# Patient Record
Sex: Female | Born: 1960 | Race: White | Hispanic: No | Smoking: Never smoker
Health system: Southern US, Community
[De-identification: ages and names within clinical notes are randomized; demographics above are authoritative.]

## PROBLEM LIST (undated history)

## (undated) DIAGNOSIS — K5792 Diverticulitis of intestine, part unspecified, without perforation or abscess without bleeding: Secondary | ICD-10-CM

## (undated) DIAGNOSIS — Z9889 Other specified postprocedural states: Secondary | ICD-10-CM

## (undated) DIAGNOSIS — Z87442 Personal history of urinary calculi: Secondary | ICD-10-CM

## (undated) DIAGNOSIS — C4491 Basal cell carcinoma of skin, unspecified: Secondary | ICD-10-CM

## (undated) DIAGNOSIS — R112 Nausea with vomiting, unspecified: Secondary | ICD-10-CM

## (undated) HISTORY — PX: COLONOSCOPY: SHX174

## (undated) HISTORY — PX: BREAST SURGERY: SHX581

## (undated) HISTORY — PX: RHINOPLASTY: SUR1284

## (undated) HISTORY — PX: ABDOMINAL HYSTERECTOMY: SHX81

## (undated) HISTORY — PX: EYE SURGERY: SHX253

---

## 2004-12-06 ENCOUNTER — Other Ambulatory Visit: Payer: Self-pay

## 2004-12-07 ENCOUNTER — Ambulatory Visit: Payer: Self-pay | Admitting: Urology

## 2006-03-26 ENCOUNTER — Ambulatory Visit: Payer: Self-pay | Admitting: Urology

## 2006-03-27 ENCOUNTER — Ambulatory Visit: Payer: Self-pay | Admitting: Urology

## 2006-03-28 ENCOUNTER — Ambulatory Visit: Payer: Self-pay | Admitting: Urology

## 2006-09-03 ENCOUNTER — Emergency Department: Payer: Self-pay | Admitting: Emergency Medicine

## 2007-02-13 ENCOUNTER — Ambulatory Visit: Payer: Self-pay | Admitting: Urology

## 2007-04-02 ENCOUNTER — Ambulatory Visit: Payer: Self-pay | Admitting: Urology

## 2007-08-26 ENCOUNTER — Ambulatory Visit: Payer: Self-pay | Admitting: Internal Medicine

## 2008-04-15 ENCOUNTER — Ambulatory Visit: Payer: Self-pay | Admitting: Specialist

## 2008-04-29 ENCOUNTER — Ambulatory Visit: Payer: Self-pay | Admitting: Specialist

## 2008-06-16 ENCOUNTER — Encounter: Payer: Self-pay | Admitting: Specialist

## 2008-07-06 ENCOUNTER — Encounter: Payer: Self-pay | Admitting: Specialist

## 2008-08-05 ENCOUNTER — Encounter: Payer: Self-pay | Admitting: Specialist

## 2008-10-18 ENCOUNTER — Ambulatory Visit: Payer: Self-pay | Admitting: Urology

## 2008-10-19 ENCOUNTER — Ambulatory Visit: Payer: Self-pay | Admitting: Urology

## 2008-10-21 ENCOUNTER — Ambulatory Visit: Payer: Self-pay | Admitting: Urology

## 2010-06-26 ENCOUNTER — Emergency Department: Payer: Self-pay | Admitting: Emergency Medicine

## 2010-11-14 ENCOUNTER — Ambulatory Visit: Payer: Self-pay | Admitting: Urology

## 2010-11-20 ENCOUNTER — Ambulatory Visit: Payer: Self-pay | Admitting: Urology

## 2010-11-30 ENCOUNTER — Ambulatory Visit: Payer: Self-pay | Admitting: Urology

## 2011-01-14 ENCOUNTER — Ambulatory Visit: Payer: Self-pay | Admitting: Family Medicine

## 2011-03-05 ENCOUNTER — Ambulatory Visit: Payer: Self-pay | Admitting: Internal Medicine

## 2012-02-10 IMAGING — CR DG ANKLE COMPLETE 3+V*L*
1 series · 5 of 5 positions shown · non-contrast
Comparison: none

REASON FOR EXAM: Twisted; + swelling
COMMENTS:   LMP: Post-Menopausal

[Series 1: view not recorded · 0.17mm/px · 5 of 5 slices shown]
[im 1/5]
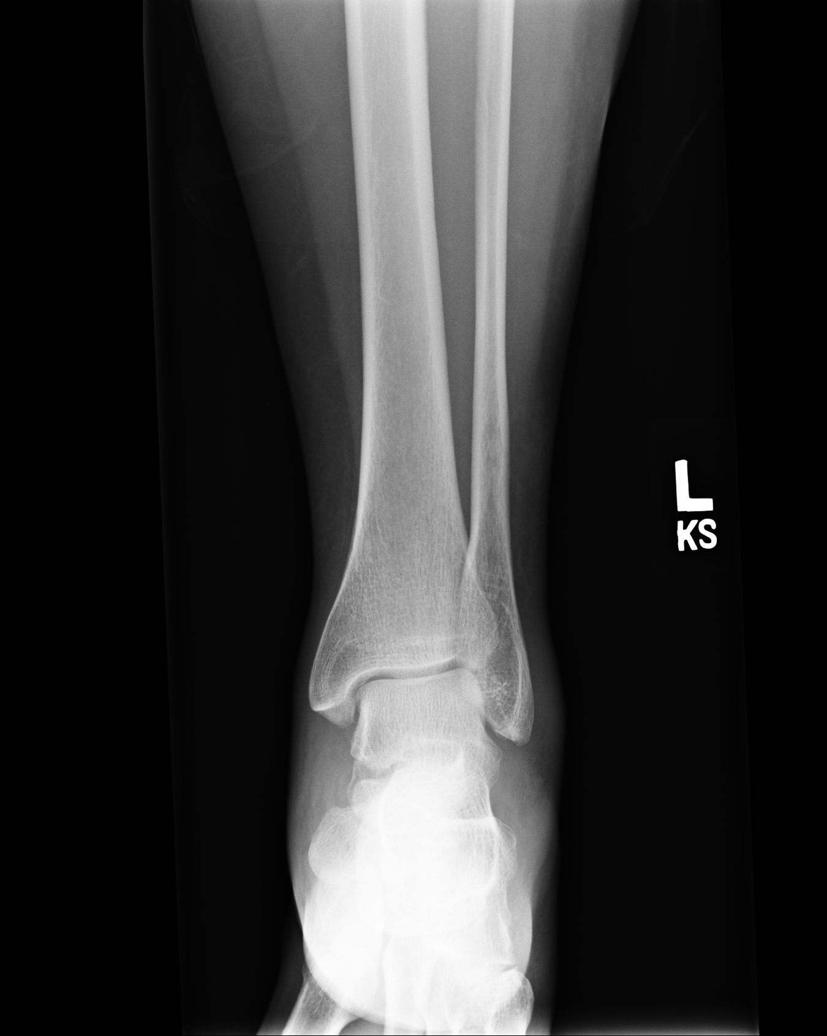
[im 2/5]
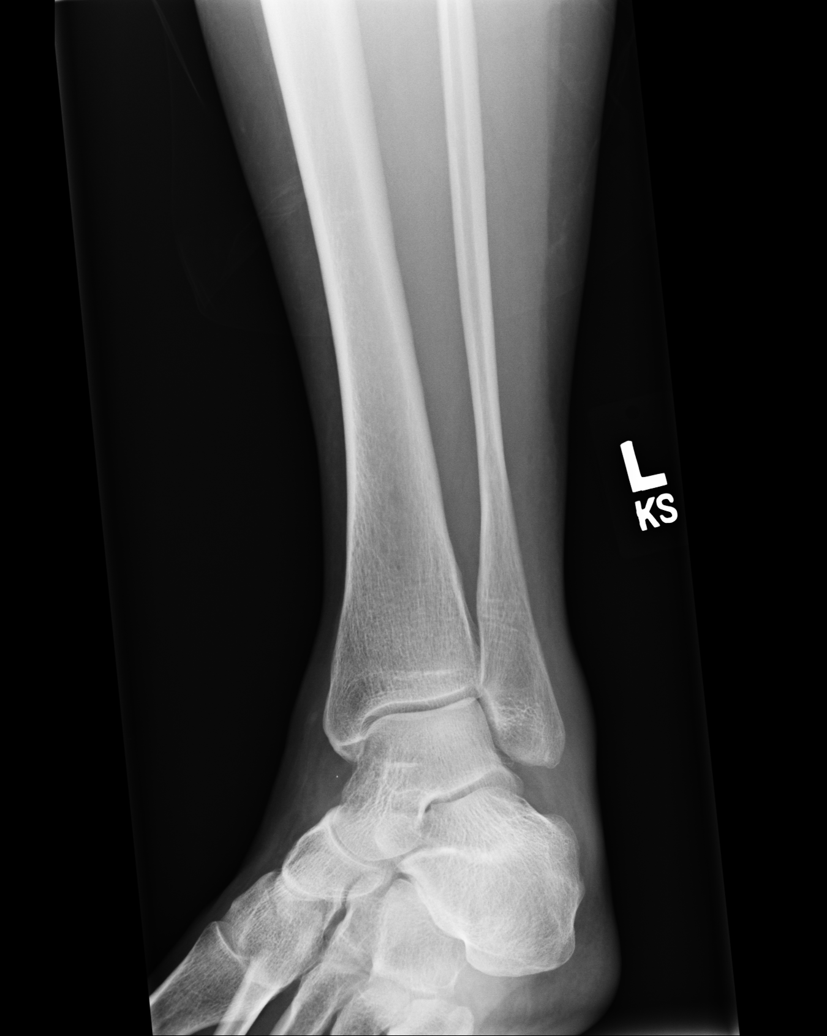
[im 3/5]
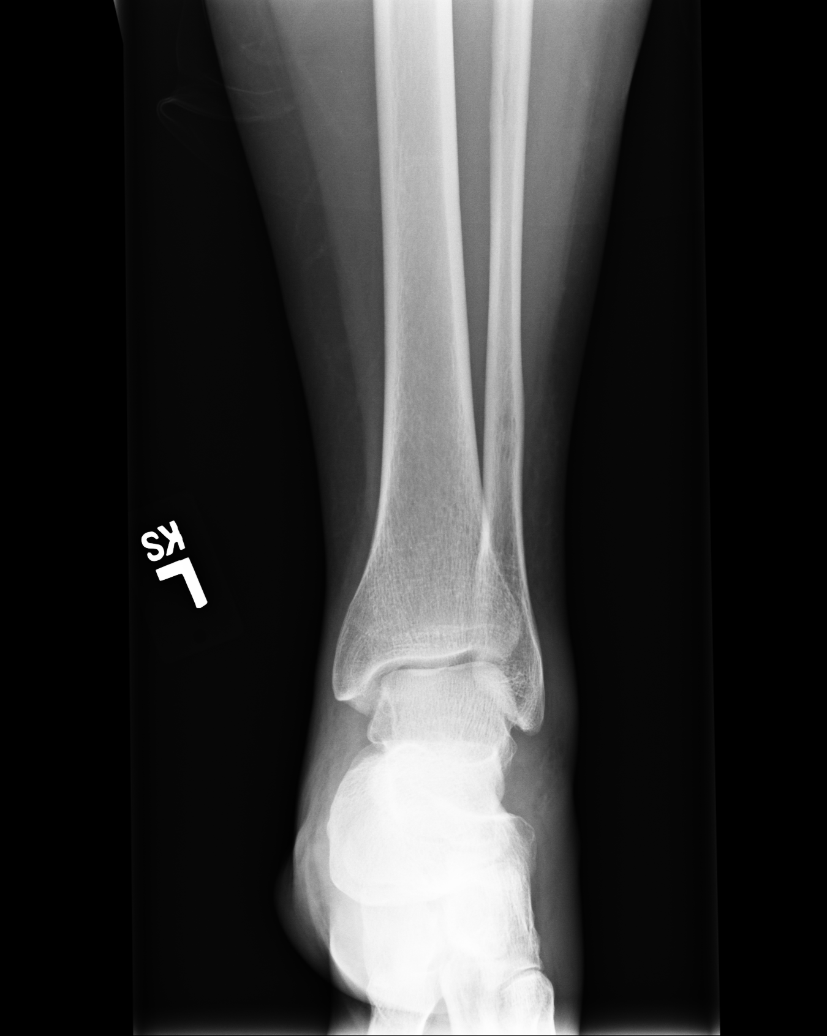
[im 4/5]
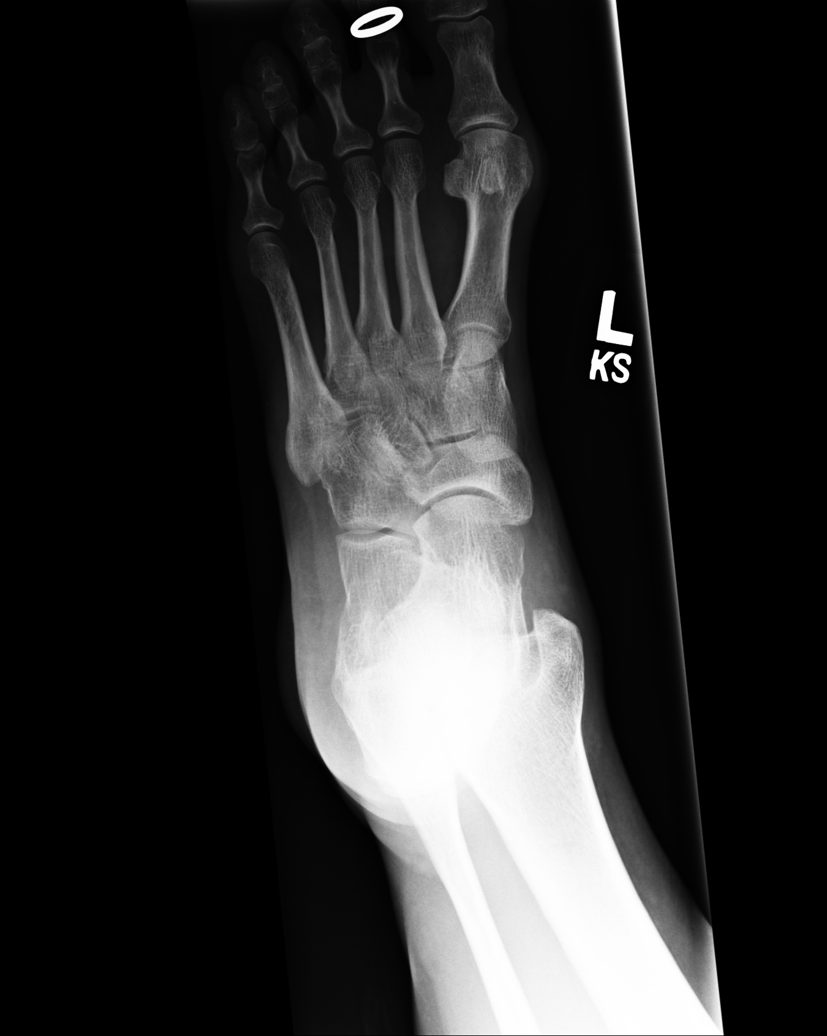
[im 5/5]
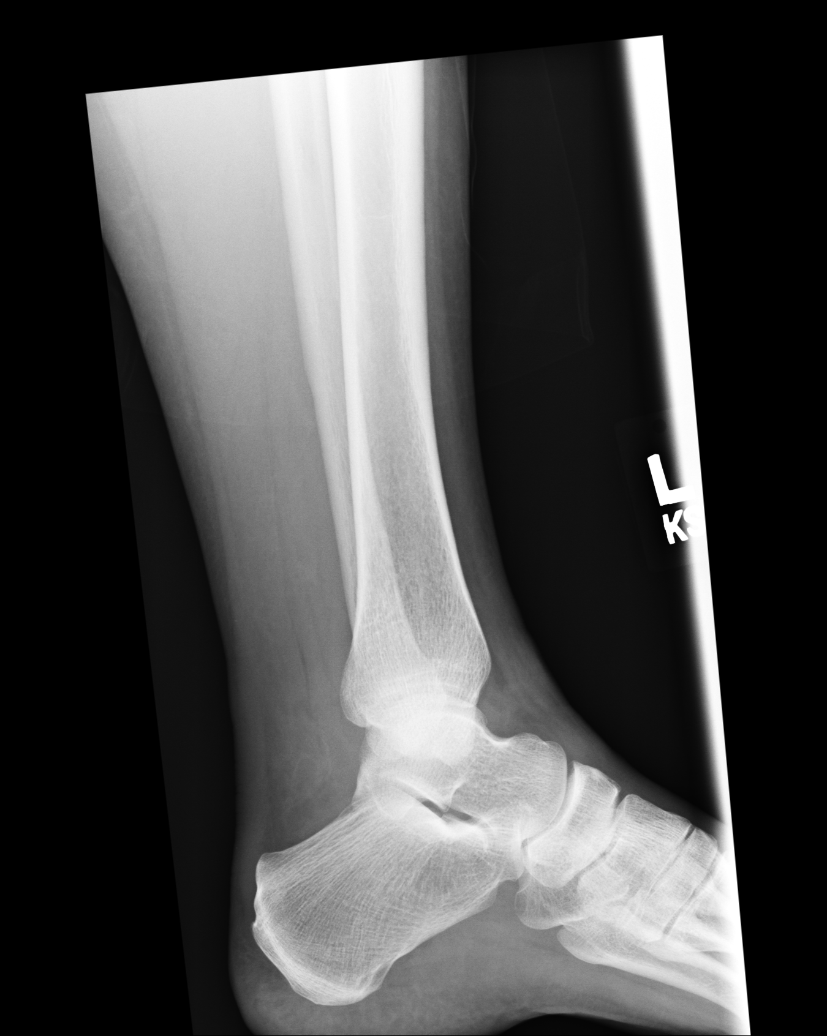

[5 of 5 positions shown; findings below may reference images not displayed]

PROCEDURE:     MDR - MDR ANKLE LEFT COMPLETE  - January 14, 2011  [DATE]

RESULT:     Five views of the left ankle are submitted. The ankle joint
mortise is preserved. The talar dome is intact. I do not see objective
evidence of an acute malleolar fracture. On an oblique view a subtle bony
density is noted along the inferior aspect of the articulation of the fibula
with the lateral aspect of the talus. A definite donor site is not
demonstrated. There is mild soft tissue swelling over the lateral malleolus.
IMPRESSION: I do not see objective evidence of an acute fracture. There
is mild soft tissue swelling laterally. Followup MRI may be useful if the
patient's symptoms do not resolve in a fashion consistent with an
uncomplicated sprain or contusion.

## 2014-11-08 ENCOUNTER — Ambulatory Visit: Payer: Self-pay | Admitting: Family Medicine

## 2016-11-05 HISTORY — PX: URETEROLITHOTOMY: SHX71

## 2016-12-05 HISTORY — PX: CYSTOSCOPY: SUR368

## 2017-01-24 DIAGNOSIS — N200 Calculus of kidney: Secondary | ICD-10-CM | POA: Insufficient documentation

## 2017-08-05 DIAGNOSIS — Z87442 Personal history of urinary calculi: Secondary | ICD-10-CM

## 2017-08-05 HISTORY — DX: Personal history of urinary calculi: Z87.442

## 2017-08-27 ENCOUNTER — Ambulatory Visit (INDEPENDENT_AMBULATORY_CARE_PROVIDER_SITE_OTHER): Payer: BC Managed Care – PPO | Admitting: Urology

## 2017-08-27 ENCOUNTER — Encounter: Payer: Self-pay | Admitting: Urology

## 2017-08-27 ENCOUNTER — Ambulatory Visit
Admission: RE | Admit: 2017-08-27 | Discharge: 2017-08-27 | Disposition: A | Discharge status: Home / Self Care | Payer: BC Managed Care – PPO | Source: Ambulatory Visit | Attending: Urology | Admitting: Urology

## 2017-08-27 VITALS — BP 121/80 | HR 97 | Ht 68.0 in | Wt 156.6 lb

## 2017-08-27 DIAGNOSIS — N200 Calculus of kidney: Secondary | ICD-10-CM

## 2017-08-28 LAB — MICROSCOPIC EXAMINATION
BACTERIA UA: NONE SEEN
Epithelial Cells (non renal): NONE SEEN /hpf (ref 0–10)
RBC, UA: >30 /hpf — ABNORMAL HIGH (ref 0–?)

## 2017-08-28 LAB — URINALYSIS, COMPLETE
Bilirubin, UA: NEGATIVE
GLUCOSE, UA: NEGATIVE
Ketones, UA: NEGATIVE
Nitrite, UA: NEGATIVE
PH UA: 6 (ref 5.0–7.5)
Specific Gravity, UA: 1.025 (ref 1.005–1.030)
Urobilinogen, Ur: 0.2 mg/dL (ref 0.2–1.0)

## 2017-08-28 NOTE — Progress Notes (Signed)
08/27/2017 8:20 AM   Lawrence Marseilles February 04, 1961 [id number]   Chief Complaint  Patient presents with  . Nephrolithiasis    HPI: 56 y.o. female with recurrent nephrolithiasis.  She is status post left ureteroscopic stone removal January 2018.  She has right nephrolithiasis with a nonobstructing right renal pelvic calculus and was preparing to be treated with shockwave lithotripsy however in late September she developed left renal colic.  A KUB showed left nephrolithiasis but a definite ureteral calculus was not identified.  She had significant increase in her pain last week.  She denies fever, chills, nausea or vomiting.  There are no identifiable precipitating, aggravating or alleviating factors.  Pain severity has rated from mild to severe.   PMH: Recurrent nephrolithiasis Diverticulitis  Surgical History: . BREAST BIOPSY  . COLONOSCOPY  . HYSTERECTOMY  . MAMMOGRAM, BILATERAL [facility] HISTORICAL RESULT) 2017  . PR CYSTO/URETERO/PYELOSCOPY W/LITHOTRIPSY Left 12/05/2016  Procedure: CYSTOURETHROSCOPY, WITH URETEROSCOPY &/OR PYELOSCOPY; WITH LITHOTRIPSY (URETERAL CATHETERIZATION INCLUDED); Surgeon: Karlyne Greenspan, MD; Location: [facility]; Service: Urology  . PR CYSTOSCOPY,INSERT URETERAL STENT Left 12/05/2016  Procedure: CYSTOURETHROSCOPY, WITH INSERTION OF INDWELLING URETERAL STENT (EG, GIBBONS OR DOUBLE-J TYPE); Surgeon: Karlyne Greenspan, MD; Location: [place]; Service: Urology  . Ureterolithiasis    Home Medications:  Allergies as of 08/27/2017   No Known Allergies     Medication List    as of 08/27/2017 11:59 PM   You have not been prescribed any medications.     Allergies: No Known Allergies  Family History: Family History  Problem Relation Age of Onset  . Heart Problems Father   . Healthy Mother     Social History:  has no tobacco, alcohol, and drug history on file.  ROS: UROLOGY Frequent Urination?: No Hard to  postpone urination?: No Burning/pain with urination?: No Get up at night to urinate?: Yes Leakage of urine?: No Urine stream starts and stops?: No Trouble starting stream?: No Do you have to strain to urinate?: No Blood in urine?: No Urinary tract infection?: No Sexually transmitted disease?: No Injury to kidneys or bladder?: No Painful intercourse?: No Weak stream?: No Currently pregnant?: No Vaginal bleeding?: No Last menstrual period?: n  Gastrointestinal Nausea?: No Vomiting?: No Indigestion/heartburn?: No Diarrhea?: No Constipation?: No  Constitutional Fever: No Night sweats?: No Weight loss?: No Fatigue?: No  Skin Skin rash/lesions?: No Itching?: No  Eyes Blurred vision?: No Double vision?: No  Ears/Nose/Throat Sore throat?: No Sinus problems?: No  Hematologic/Lymphatic Swollen glands?: No Easy bruising?: No  Cardiovascular Leg swelling?: No Chest pain?: No  Respiratory Cough?: No Shortness of breath?: No  Endocrine Excessive thirst?: No  Musculoskeletal Back pain?: No Joint pain?: No  Neurological Headaches?: No Dizziness?: No  Psychologic Depression?: No Anxiety?: No  Physical Exam: BP 121/80 (BP Location: Right Arm, Patient Position: Sitting, Cuff Size: Normal)   Pulse 97   Ht 5\' 8"  (1.727 m)   Wt 156 lb 9.6 oz (71 kg)   BMI 23.81 kg/m   Constitutional:  Alert and oriented, No acute distress. HEENT: [place] AT, moist mucus membranes.  Trachea midline, no masses. Cardiovascular: No clubbing, cyanosis, or edema. RRR Respiratory: Normal respiratory effort, no increased work of breathing. Lungs clear GI: Abdomen is soft, nontender, nondistended, no abdominal masses GU: No CVA tenderness.  Skin: No rashes, bruises or suspicious lesions. Lymph: No cervical or inguinal adenopathy. Neurologic: Grossly intact, no focal deficits, moving all 4 extremities. Psychiatric: Normal mood and affect.  Laboratory Data:  Urinalysis Lab Results    Component Value Date   SPECGRAV 1.025 08/27/2017   PHUR 6.0 08/27/2017   COLORU Yellow 08/27/2017   APPEARANCEUR Clear 08/27/2017   LEUKOCYTESUR 1+ (A) 08/27/2017   PROTEINUR 2+ (A) 08/27/2017   GLUCOSEU Negative 08/27/2017   KETONESU Negative 08/27/2017   RBCU 3+ (A) 08/27/2017   BILIRUBINUR Negative 08/27/2017   UUROB 0.2 08/27/2017   NITRITE Negative 08/27/2017    Lab Results  Component Value Date   LABMICR See below: 08/27/2017   WBCUA 0-5 08/27/2017   RBCUA >30 (H) 08/27/2017   LABEPIT None seen 08/27/2017   MUCUS Present (A) 08/27/2017   BACTERIA None seen 08/27/2017    Pertinent Imaging: KUB reviewed   Assessment & Plan:   1.  Left renal colic  Repeat KUB was performed today and there is an approximately 8 mm left proximal ureteral calculus.  Stable right nephrolithiasis with 9 and 10 mm calculi.  She was notified with results.  Treatment options were discussed including shockwave lithotripsy and ureteroscopy with laser lithotripsy.  Bilateral shockwave lithotripsy could not be performed however bilateral ureteroscopy could.  The pros and cons of each treatment were discussed.  Based on stone size it is unlikely the left ureteral calculus will pass.  - Urinalysis, Complete - Abdomen 1 view (KUB); Future     Abbie Sons, [place] [address] [place],  [address] [phone]

## 2017-08-30 ENCOUNTER — Telehealth: Payer: Self-pay | Admitting: Urology

## 2017-08-30 NOTE — Telephone Encounter (Signed)
-----   Message from Abbie Sons, MD sent at 08/30/2017  7:52 AM EDT ----- Notified of KUB results.  After discussing options she desires to schedule shockwave lithotripsy for her left ureteral calculus next week.

## 2017-08-30 NOTE — Telephone Encounter (Signed)
Patient needs Litho next week   Thanks, Sharyn Lull

## 2017-09-02 ENCOUNTER — Encounter: Payer: Self-pay | Admitting: Urology

## 2017-09-03 ENCOUNTER — Encounter: Payer: Self-pay | Admitting: *Deleted

## 2017-09-04 MED ORDER — CIPROFLOXACIN HCL 500 MG PO TABS
500.0000 mg | ORAL_TABLET | ORAL | Status: AC
  Administered 2017-09-05: 500 mg via ORAL

## 2017-09-05 ENCOUNTER — Encounter
Admission: RE | Disposition: A | Discharge status: Home / Self Care | Payer: Self-pay | Source: Ambulatory Visit | Attending: Urology

## 2017-09-05 ENCOUNTER — Ambulatory Visit: Payer: BC Managed Care – PPO

## 2017-09-05 ENCOUNTER — Ambulatory Visit
Admission: RE | Admit: 2017-09-05 | Discharge: 2017-09-05 | Disposition: A | Discharge status: Home / Self Care | Payer: BC Managed Care – PPO | Source: Ambulatory Visit | Attending: Urology | Admitting: Urology

## 2017-09-05 ENCOUNTER — Encounter: Payer: Self-pay | Admitting: *Deleted

## 2017-09-05 DIAGNOSIS — Z90710 Acquired absence of both cervix and uterus: Secondary | ICD-10-CM | POA: Diagnosis not present

## 2017-09-05 DIAGNOSIS — N202 Calculus of kidney with calculus of ureter: Secondary | ICD-10-CM | POA: Diagnosis present

## 2017-09-05 DIAGNOSIS — Z8719 Personal history of other diseases of the digestive system: Secondary | ICD-10-CM | POA: Insufficient documentation

## 2017-09-05 DIAGNOSIS — N200 Calculus of kidney: Secondary | ICD-10-CM

## 2017-09-05 HISTORY — PX: EXTRACORPOREAL SHOCK WAVE LITHOTRIPSY: SHX1557

## 2017-09-05 HISTORY — DX: Diverticulitis of intestine, part unspecified, without perforation or abscess without bleeding: K57.92

## 2017-09-05 HISTORY — DX: Personal history of urinary calculi: Z87.442

## 2017-09-05 SURGERY — LITHOTRIPSY, ESWL
Anesthesia: Moderate Sedation | Laterality: Left

## 2017-09-05 MED ORDER — TAMSULOSIN HCL 0.4 MG PO CAPS: 0.4000 mg | ORAL_CAPSULE | Freq: Every day | ORAL | 0 refills | Status: DC

## 2017-09-05 MED ORDER — ONDANSETRON HCL 4 MG/2ML IJ SOLN
4.0000 mg | Freq: Once | INTRAMUSCULAR | Status: AC
  Administered 2017-09-05: 4 mg via INTRAVENOUS

## 2017-09-05 MED ORDER — DIPHENHYDRAMINE HCL 25 MG PO CAPS
25.0000 mg | ORAL_CAPSULE | ORAL | Status: AC
  Administered 2017-09-05: 25 mg via ORAL

## 2017-09-05 MED ORDER — OXYCODONE-ACETAMINOPHEN 5-325 MG PO TABS: 1.0000 | ORAL_TABLET | ORAL | 0 refills | Status: DC | PRN

## 2017-09-05 MED ORDER — SODIUM CHLORIDE 0.9 % IV SOLN
Freq: Once | INTRAVENOUS | Status: AC
  Administered 2017-09-05: 08:00:00 via INTRAVENOUS

## 2017-09-05 MED ORDER — DIAZEPAM 5 MG PO TABS
10.0000 mg | ORAL_TABLET | ORAL | Status: AC
  Administered 2017-09-05: 10 mg via ORAL

## 2017-09-05 MED ORDER — DIAZEPAM 5 MG PO TABS
ORAL_TABLET | ORAL | Status: AC
  Administered 2017-09-05: 10 mg via ORAL
  Filled 2017-09-05: qty 2

## 2017-09-05 MED ORDER — CIPROFLOXACIN HCL 500 MG PO TABS
ORAL_TABLET | ORAL | Status: AC
  Administered 2017-09-05: 500 mg via ORAL
  Filled 2017-09-05: qty 1

## 2017-09-05 MED ORDER — DIPHENHYDRAMINE HCL 25 MG PO CAPS
ORAL_CAPSULE | ORAL | Status: AC
  Administered 2017-09-05: 25 mg via ORAL
  Filled 2017-09-05: qty 1

## 2017-09-05 MED ORDER — ONDANSETRON HCL 4 MG/2ML IJ SOLN
INTRAMUSCULAR | Status: AC
  Administered 2017-09-05: 4 mg via INTRAVENOUS
  Filled 2017-09-05: qty 2

## 2017-09-05 NOTE — H&P (View-Only) (Signed)
08/27/2017 8:20 AM   Brittney Andrews 06-26-1961 [id number]   Chief Complaint  Patient presents with  . Nephrolithiasis    HPI: 56 y.o. female with recurrent nephrolithiasis.  She is status post left ureteroscopic stone removal January 2018.  She has right nephrolithiasis with a nonobstructing right renal pelvic calculus and was preparing to be treated with shockwave lithotripsy however in late September she developed left renal colic.  A KUB showed left nephrolithiasis but a definite ureteral calculus was not identified.  She had significant increase in her pain last week.  She denies fever, chills, nausea or vomiting.  There are no identifiable precipitating, aggravating or alleviating factors.  Pain severity has rated from mild to severe.   PMH: Recurrent nephrolithiasis Diverticulitis  Surgical History: . BREAST BIOPSY  . COLONOSCOPY  . HYSTERECTOMY  . MAMMOGRAM, BILATERAL [facility] HISTORICAL RESULT) 2017  . PR CYSTO/URETERO/PYELOSCOPY W/LITHOTRIPSY Left 12/05/2016  Procedure: CYSTOURETHROSCOPY, WITH URETEROSCOPY &/OR PYELOSCOPY; WITH LITHOTRIPSY (URETERAL CATHETERIZATION INCLUDED); Surgeon: Karlyne Greenspan, MD; Location: [place]; Service: Urology  . PR CYSTOSCOPY,INSERT URETERAL STENT Left 12/05/2016  Procedure: CYSTOURETHROSCOPY, WITH INSERTION OF INDWELLING URETERAL STENT (EG, GIBBONS OR DOUBLE-J TYPE); Surgeon: Karlyne Greenspan, MD; Location: [place]; Service: Urology  . Ureterolithiasis    Home Medications:  Allergies as of 08/27/2017   No Known Allergies     Medication List    as of 08/27/2017 11:59 PM   You have not been prescribed any medications.     Allergies: No Known Allergies  Family History: Family History  Problem Relation Age of Onset  . Heart Problems Father   . Healthy Mother     Social History:  has no tobacco, alcohol, and drug history on file.  ROS: UROLOGY Frequent Urination?: No Hard to  postpone urination?: No Burning/pain with urination?: No Get up at night to urinate?: Yes Leakage of urine?: No Urine stream starts and stops?: No Trouble starting stream?: No Do you have to strain to urinate?: No Blood in urine?: No Urinary tract infection?: No Sexually transmitted disease?: No Injury to kidneys or bladder?: No Painful intercourse?: No Weak stream?: No Currently pregnant?: No Vaginal bleeding?: No Last menstrual period?: n  Gastrointestinal Nausea?: No Vomiting?: No Indigestion/heartburn?: No Diarrhea?: No Constipation?: No  Constitutional Fever: No Night sweats?: No Weight loss?: No Fatigue?: No  Skin Skin rash/lesions?: No Itching?: No  Eyes Blurred vision?: No Double vision?: No  Ears/Nose/Throat Sore throat?: No Sinus problems?: No  Hematologic/Lymphatic Swollen glands?: No Easy bruising?: No  Cardiovascular Leg swelling?: No Chest pain?: No  Respiratory Cough?: No Shortness of breath?: No  Endocrine Excessive thirst?: No  Musculoskeletal Back pain?: No Joint pain?: No  Neurological Headaches?: No Dizziness?: No  Psychologic Depression?: No Anxiety?: No  Physical Exam: BP 121/80 (BP Location: Right Arm, Patient Position: Sitting, Cuff Size: Normal)   Pulse 97   Ht 5\' 8"  (1.727 m)   Wt 156 lb 9.6 oz (71 kg)   BMI 23.81 kg/m   Constitutional:  Alert and oriented, No acute distress. HEENT: [place] AT, moist mucus membranes.  Trachea midline, no masses. Cardiovascular: No clubbing, cyanosis, or edema. RRR Respiratory: Normal respiratory effort, no increased work of breathing. Lungs clear GI: Abdomen is soft, nontender, nondistended, no abdominal masses GU: No CVA tenderness.  Skin: No rashes, bruises or suspicious lesions. Lymph: No cervical or inguinal adenopathy. Neurologic: Grossly intact, no focal deficits, moving all 4 extremities. Psychiatric: Normal mood and affect.  Laboratory Data:  Urinalysis Lab Results    Component Value Date   SPECGRAV 1.025 08/27/2017   PHUR 6.0 08/27/2017   COLORU Yellow 08/27/2017   APPEARANCEUR Clear 08/27/2017   LEUKOCYTESUR 1+ (A) 08/27/2017   PROTEINUR 2+ (A) 08/27/2017   GLUCOSEU Negative 08/27/2017   KETONESU Negative 08/27/2017   RBCU 3+ (A) 08/27/2017   BILIRUBINUR Negative 08/27/2017   UUROB 0.2 08/27/2017   NITRITE Negative 08/27/2017    Lab Results  Component Value Date   LABMICR See below: 08/27/2017   WBCUA 0-5 08/27/2017   RBCUA >30 (H) 08/27/2017   LABEPIT None seen 08/27/2017   MUCUS Present (A) 08/27/2017   BACTERIA None seen 08/27/2017    Pertinent Imaging: KUB reviewed   Assessment & Plan:   1.  Left renal colic  Repeat KUB was performed today and there is an approximately 8 mm left proximal ureteral calculus.  Stable right nephrolithiasis with 9 and 10 mm calculi.  She was notified with results.  Treatment options were discussed including shockwave lithotripsy and ureteroscopy with laser lithotripsy.  Bilateral shockwave lithotripsy could not be performed however bilateral ureteroscopy could.  The pros and cons of each treatment were discussed.  Based on stone size it is unlikely the left ureteral calculus will pass.  - Urinalysis, Complete - Abdomen 1 view (KUB); Future     Abbie Sons, [place] [address], [place], [place] [address] [phone]

## 2017-09-05 NOTE — Interval H&P Note (Signed)
History and Physical Interval Note:  09/05/2017 8:21 AM  Brittney Andrews  has presented today for surgery, with the diagnosis of Kidney stone  The various methods of treatment have been discussed with the patient and family. After consideration of risks, benefits and other options for treatment, the patient has consented to  Procedure(s): EXTRACORPOREAL SHOCK WAVE LITHOTRIPSY (ESWL) (Left) as a surgical intervention .  The patient's history has been reviewed, patient examined, no change in status, stable for surgery.  I have reviewed the patient's chart and labs.  Questions were answered to the patient's satisfaction.    RRR CTAB   Hollice Espy

## 2017-09-05 NOTE — Discharge Instructions (Signed)
See [facility] discharge instructions in chart.  

## 2017-09-19 ENCOUNTER — Ambulatory Visit (INDEPENDENT_AMBULATORY_CARE_PROVIDER_SITE_OTHER): Payer: BC Managed Care – PPO | Admitting: Urology

## 2017-09-19 ENCOUNTER — Encounter: Payer: Self-pay | Admitting: Urology

## 2017-09-19 ENCOUNTER — Ambulatory Visit
Admission: RE | Admit: 2017-09-19 | Discharge: 2017-09-19 | Disposition: A | Discharge status: Home / Self Care | Payer: BC Managed Care – PPO | Source: Ambulatory Visit | Attending: Urology | Admitting: Urology

## 2017-09-19 VITALS — BP 104/66 | HR 78 | Ht 68.0 in | Wt 150.0 lb

## 2017-09-19 DIAGNOSIS — N200 Calculus of kidney: Secondary | ICD-10-CM

## 2017-09-19 NOTE — H&P (View-Only) (Signed)
09/19/2017 1:35 PM   Brittney Andrews Jun 17, 1961 [id number]  Referring provider: Sofie Hartigan, MD [place], [place] [address]  HPI: 56 year old female presents for postop follow-up.  She is status post shockwave lithotripsy of a left proximal ureteral calculus on 09/05/2017.  She had no postoperative problems and had no significant colic.  She passed several small fragments which she brings in today.  She has no significant pain.  She does have right nephrolithiasis and wants to schedule treatment of her right-sided stones before the end of the year.   PMH: Past Medical History:  Diagnosis Date  . Diverticulitis   . History of kidney stones 08/2017   right nephrolithiasis, left renal colic    Surgical History: Past Surgical History:  Procedure Laterality Date  . ABDOMINAL HYSTERECTOMY    . BREAST SURGERY     biopsy  . COLONOSCOPY    . CYSTOSCOPY Left 12/05/2016   stent placement  . EXTRACORPOREAL SHOCK WAVE LITHOTRIPSY Left 09/05/2017   Procedure: EXTRACORPOREAL SHOCK WAVE LITHOTRIPSY (ESWL);  Surgeon: Hollice Espy, MD;  Location: [facility];  Service: Urology;  Laterality: Left;  . URETEROLITHOTOMY Left 11/2016    Home Medications:  Allergies as of 09/19/2017   No Known Allergies     Medication List    as of 09/19/2017  1:35 PM   You have not been prescribed any medications.     Allergies: No Known Allergies  Family History: Family History  Problem Relation Age of Onset  . Heart Problems Father   . Healthy Mother     Social History:  reports that  has never smoked. she has never used smokeless tobacco. She reports that she does not drink alcohol or use drugs.  ROS: UROLOGY Frequent Urination?: No Hard to postpone urination?: No Burning/pain with urination?: No Get up at night to urinate?: Yes Leakage of urine?: No Urine stream starts and stops?: No Trouble starting stream?: No Do you have to strain to urinate?: No Blood in  urine?: No Urinary tract infection?: No Sexually transmitted disease?: No Injury to kidneys or bladder?: No Painful intercourse?: No Weak stream?: No Currently pregnant?: No Vaginal bleeding?: No Last menstrual period?: n  Gastrointestinal Nausea?: No Vomiting?: No Indigestion/heartburn?: No Diarrhea?: No Constipation?: No  Constitutional Fever: No Night sweats?: No Weight loss?: No Fatigue?: No  Skin Skin rash/lesions?: No Itching?: No  Eyes Blurred vision?: No Double vision?: No  Ears/Nose/Throat Sore throat?: No Sinus problems?: No  Hematologic/Lymphatic Swollen glands?: No Easy bruising?: No  Cardiovascular Leg swelling?: No Chest pain?: No  Respiratory Cough?: No Shortness of breath?: No  Endocrine Excessive thirst?: No  Musculoskeletal Back pain?: No Joint pain?: No  Neurological Headaches?: No Dizziness?: No  Psychologic Depression?: No Anxiety?: No  Physical Exam: BP 104/66   Pulse 78   Ht 5\' 8"  (1.727 m)   Wt 150 lb (68 kg)   BMI 22.81 kg/m   Constitutional:  Alert and oriented, No acute distress. HEENT: [place] AT, moist mucus membranes.  Trachea midline, no masses. Cardiovascular: No clubbing, cyanosis, or edema. Respiratory: Normal respiratory effort, no increased work of breathing. GI: Abdomen is soft, nontender, nondistended, no abdominal masses GU: No CVA tenderness.  Skin: No rashes, bruises or suspicious lesions. Lymph: No cervical or inguinal adenopathy. Neurologic: Grossly intact, no focal deficits, moving all 4 extremities. Psychiatric: Normal mood and affect.   Assessment & Plan:    1. Nephrolithiasis KUB performed today was reviewed.  There are no calcifications  or fragments identified along the expected course of the ureter.  Right nephrolithiasis noted. Schedule follow-up renal ultrasound to make sure there is no left hydronephrosis prior to treating her right-sided stones.  - Ultrasound renal complete;  Future    Abbie Sons, [place] [address], [place], [place] [address] [phone]

## 2017-09-19 NOTE — Progress Notes (Signed)
09/19/2017 1:35 PM   Brittney Andrews September 27, 1961 [id number]  Referring provider: Sofie Hartigan, MD [place], [place] [address]  HPI: 56 year old female presents for postop follow-up.  She is status post shockwave lithotripsy of a left proximal ureteral calculus on 09/05/2017.  She had no postoperative problems and had no significant colic.  She passed several small fragments which she brings in today.  She has no significant pain.  She does have right nephrolithiasis and wants to schedule treatment of her right-sided stones before the end of the year.   PMH: Past Medical History:  Diagnosis Date  . Diverticulitis   . History of kidney stones 08/2017   right nephrolithiasis, left renal colic    Surgical History: Past Surgical History:  Procedure Laterality Date  . ABDOMINAL HYSTERECTOMY    . BREAST SURGERY     biopsy  . COLONOSCOPY    . CYSTOSCOPY Left 12/05/2016   stent placement  . EXTRACORPOREAL SHOCK WAVE LITHOTRIPSY Left 09/05/2017   Procedure: EXTRACORPOREAL SHOCK WAVE LITHOTRIPSY (ESWL);  Surgeon: Hollice Espy, MD;  Location: [facility];  Service: Urology;  Laterality: Left;  . URETEROLITHOTOMY Left 11/2016    Home Medications:  Allergies as of 09/19/2017   No Known Allergies     Medication List    as of 09/19/2017  1:35 PM   You have not been prescribed any medications.     Allergies: No Known Allergies  Family History: Family History  Problem Relation Age of Onset  . Heart Problems Father   . Healthy Mother     Social History:  reports that  has never smoked. she has never used smokeless tobacco. She reports that she does not drink alcohol or use drugs.  ROS: UROLOGY Frequent Urination?: No Hard to postpone urination?: No Burning/pain with urination?: No Get up at night to urinate?: Yes Leakage of urine?: No Urine stream starts and stops?: No Trouble starting stream?: No Do you have to strain to urinate?: No Blood in  urine?: No Urinary tract infection?: No Sexually transmitted disease?: No Injury to kidneys or bladder?: No Painful intercourse?: No Weak stream?: No Currently pregnant?: No Vaginal bleeding?: No Last menstrual period?: n  Gastrointestinal Nausea?: No Vomiting?: No Indigestion/heartburn?: No Diarrhea?: No Constipation?: No  Constitutional Fever: No Night sweats?: No Weight loss?: No Fatigue?: No  Skin Skin rash/lesions?: No Itching?: No  Eyes Blurred vision?: No Double vision?: No  Ears/Nose/Throat Sore throat?: No Sinus problems?: No  Hematologic/Lymphatic Swollen glands?: No Easy bruising?: No  Cardiovascular Leg swelling?: No Chest pain?: No  Respiratory Cough?: No Shortness of breath?: No  Endocrine Excessive thirst?: No  Musculoskeletal Back pain?: No Joint pain?: No  Neurological Headaches?: No Dizziness?: No  Psychologic Depression?: No Anxiety?: No  Physical Exam: BP 104/66   Pulse 78   Ht 5\' 8"  (1.727 m)   Wt 150 lb (68 kg)   BMI 22.81 kg/m   Constitutional:  Alert and oriented, No acute distress. HEENT: [place] AT, moist mucus membranes.  Trachea midline, no masses. Cardiovascular: No clubbing, cyanosis, or edema. Respiratory: Normal respiratory effort, no increased work of breathing. GI: Abdomen is soft, nontender, nondistended, no abdominal masses GU: No CVA tenderness.  Skin: No rashes, bruises or suspicious lesions. Lymph: No cervical or inguinal adenopathy. Neurologic: Grossly intact, no focal deficits, moving all 4 extremities. Psychiatric: Normal mood and affect.   Assessment & Plan:    1. Nephrolithiasis KUB performed today was reviewed.  There are no calcifications  or fragments identified along the expected course of the ureter.  Right nephrolithiasis noted. Schedule follow-up renal ultrasound to make sure there is no left hydronephrosis prior to treating her right-sided stones.  - Ultrasound renal complete;  Future    Abbie Sons, [place] [address] [place],  [address] [phone]

## 2017-09-27 ENCOUNTER — Ambulatory Visit
Admission: RE | Admit: 2017-09-27 | Discharge: 2017-09-27 | Disposition: A | Discharge status: Home / Self Care | Payer: BC Managed Care – PPO | Source: Ambulatory Visit | Attending: Urology | Admitting: Urology

## 2017-09-27 DIAGNOSIS — N200 Calculus of kidney: Secondary | ICD-10-CM | POA: Insufficient documentation

## 2017-10-08 ENCOUNTER — Other Ambulatory Visit: Payer: BC Managed Care – PPO

## 2017-10-08 ENCOUNTER — Other Ambulatory Visit: Payer: Self-pay | Admitting: Radiology

## 2017-10-08 DIAGNOSIS — N200 Calculus of kidney: Secondary | ICD-10-CM

## 2017-10-09 ENCOUNTER — Ambulatory Visit: Admit: 2017-10-09 | Payer: BC Managed Care – PPO

## 2017-10-09 SURGERY — LITHOTRIPSY, ESWL
Anesthesia: Moderate Sedation | Laterality: Left

## 2017-10-10 LAB — CULTURE, URINE COMPREHENSIVE

## 2017-10-16 MED ORDER — CIPROFLOXACIN HCL 500 MG PO TABS
500.0000 mg | ORAL_TABLET | ORAL | Status: AC
  Administered 2017-10-17: 500 mg via ORAL

## 2017-10-17 ENCOUNTER — Other Ambulatory Visit: Payer: Self-pay

## 2017-10-17 ENCOUNTER — Other Ambulatory Visit: Payer: Self-pay | Admitting: Urology

## 2017-10-17 ENCOUNTER — Ambulatory Visit: Payer: BC Managed Care – PPO

## 2017-10-17 ENCOUNTER — Ambulatory Visit
Admission: RE | Admit: 2017-10-17 | Discharge: 2017-10-17 | Disposition: A | Discharge status: Home / Self Care | Payer: BC Managed Care – PPO | Source: Ambulatory Visit | Attending: Urology | Admitting: Urology

## 2017-10-17 ENCOUNTER — Encounter: Payer: Self-pay | Admitting: *Deleted

## 2017-10-17 ENCOUNTER — Encounter
Admission: RE | Disposition: A | Discharge status: Home / Self Care | Payer: Self-pay | Source: Ambulatory Visit | Attending: Urology

## 2017-10-17 DIAGNOSIS — Z791 Long term (current) use of non-steroidal anti-inflammatories (NSAID): Secondary | ICD-10-CM | POA: Diagnosis not present

## 2017-10-17 DIAGNOSIS — Z7982 Long term (current) use of aspirin: Secondary | ICD-10-CM | POA: Diagnosis not present

## 2017-10-17 DIAGNOSIS — Z79899 Other long term (current) drug therapy: Secondary | ICD-10-CM | POA: Diagnosis not present

## 2017-10-17 DIAGNOSIS — Z7901 Long term (current) use of anticoagulants: Secondary | ICD-10-CM | POA: Diagnosis not present

## 2017-10-17 DIAGNOSIS — Z87442 Personal history of urinary calculi: Secondary | ICD-10-CM | POA: Diagnosis not present

## 2017-10-17 DIAGNOSIS — N200 Calculus of kidney: Secondary | ICD-10-CM | POA: Diagnosis present

## 2017-10-17 HISTORY — PX: EXTRACORPOREAL SHOCK WAVE LITHOTRIPSY: SHX1557

## 2017-10-17 SURGERY — LITHOTRIPSY, ESWL
Anesthesia: Moderate Sedation | Laterality: Right

## 2017-10-17 MED ORDER — ONDANSETRON HCL 4 MG PO TABS: 4.0000 mg | ORAL_TABLET | Freq: Three times a day (TID) | ORAL | 0 refills | Status: DC | PRN

## 2017-10-17 MED ORDER — DIPHENHYDRAMINE HCL 25 MG PO CAPS
ORAL_CAPSULE | ORAL | Status: AC
  Administered 2017-10-17: 25 mg via ORAL
  Filled 2017-10-17: qty 1

## 2017-10-17 MED ORDER — DIAZEPAM 5 MG PO TABS
ORAL_TABLET | ORAL | Status: AC
  Administered 2017-10-17: 10 mg via ORAL
  Filled 2017-10-17: qty 2

## 2017-10-17 MED ORDER — ONDANSETRON HCL 4 MG/2ML IJ SOLN
INTRAMUSCULAR | Status: AC
  Administered 2017-10-17: 4 mg via INTRAVENOUS
  Filled 2017-10-17: qty 2

## 2017-10-17 MED ORDER — DIPHENHYDRAMINE HCL 25 MG PO CAPS
25.0000 mg | ORAL_CAPSULE | ORAL | Status: AC
  Administered 2017-10-17: 25 mg via ORAL

## 2017-10-17 MED ORDER — CIPROFLOXACIN HCL 500 MG PO TABS
ORAL_TABLET | ORAL | Status: AC
Start: 2017-10-17 — End: 2017-10-17
  Administered 2017-10-17: 500 mg via ORAL
  Filled 2017-10-17: qty 1

## 2017-10-17 MED ORDER — OXYCODONE-ACETAMINOPHEN 5-325 MG PO TABS: 1.0000 | ORAL_TABLET | Freq: Four times a day (QID) | ORAL | 0 refills | Status: AC | PRN

## 2017-10-17 MED ORDER — DIAZEPAM 5 MG PO TABS
10.0000 mg | ORAL_TABLET | ORAL | Status: AC
  Administered 2017-10-17: 10 mg via ORAL

## 2017-10-17 MED ORDER — ONDANSETRON HCL 4 MG/2ML IJ SOLN
4.0000 mg | Freq: Once | INTRAMUSCULAR | Status: AC
  Administered 2017-10-17: 4 mg via INTRAVENOUS

## 2017-10-17 MED ORDER — OXYCODONE-ACETAMINOPHEN 5-325 MG PO TABS
1.0000 | ORAL_TABLET | Freq: Once | ORAL | Status: AC
  Administered 2017-10-17: 1 via ORAL

## 2017-10-17 MED ORDER — SODIUM CHLORIDE 0.9 % IV SOLN
INTRAVENOUS | Status: DC
  Administered 2017-10-17: 08:00:00 via INTRAVENOUS

## 2017-10-17 MED ORDER — TAMSULOSIN HCL 0.4 MG PO CAPS
0.4000 mg | ORAL_CAPSULE | Freq: Every day | ORAL | 0 refills | Status: AC
Start: 2017-10-17 — End: 2017-10-31

## 2017-10-17 NOTE — OR Nursing (Signed)
Reddened area on right flank with small break in skin. Small amount of sanguinous drainage on gown.

## 2017-10-17 NOTE — Interval H&P Note (Signed)
History and Physical Interval Note:  10/17/2017 9:47 AM  Brittney Andrews  has presented today for surgery, with the diagnosis of Kidney stone  The various methods of treatment have been discussed with the patient and family. After consideration of risks, benefits and other options for treatment, the patient has consented to  Procedure(s): EXTRACORPOREAL SHOCK WAVE LITHOTRIPSY (ESWL) (Right) as a surgical intervention .  The patient's history has been reviewed, patient examined, no change in status, stable for surgery.  I have reviewed the patient's chart and labs.  Questions were answered to the patient's satisfaction.     [place]

## 2017-10-17 NOTE — Discharge Instructions (Signed)

## 2017-10-18 ENCOUNTER — Encounter: Payer: Self-pay | Admitting: Urology

## 2017-10-21 ENCOUNTER — Telehealth: Payer: Self-pay

## 2017-10-21 NOTE — Telephone Encounter (Signed)
Pt called stating she is having a lot of pain and burning when trying to urinate post ESWL. Pt stated that most of the discomfort happens towards the end of her urine stream. Reinforced with pt to increase fluid in take and continue flomax and discomfort should improve within the next couple of days. Pt denied n/v, f/c. Pt voiced understanding of whole conversation.

## 2018-11-13 IMAGING — CR DG ABDOMEN 1V
1 series · 2 of 2 positions shown · non-contrast
Comparison: 09/19/2017

CLINICAL DATA: Right nephrolithiasis.  Pre lithotripsy.

EXAM:
ABDOMEN - 1 VIEW

[Series 1: dg abd 1 view · 0.14mm/px · 2 of 2 slices shown]
[im 1/2]
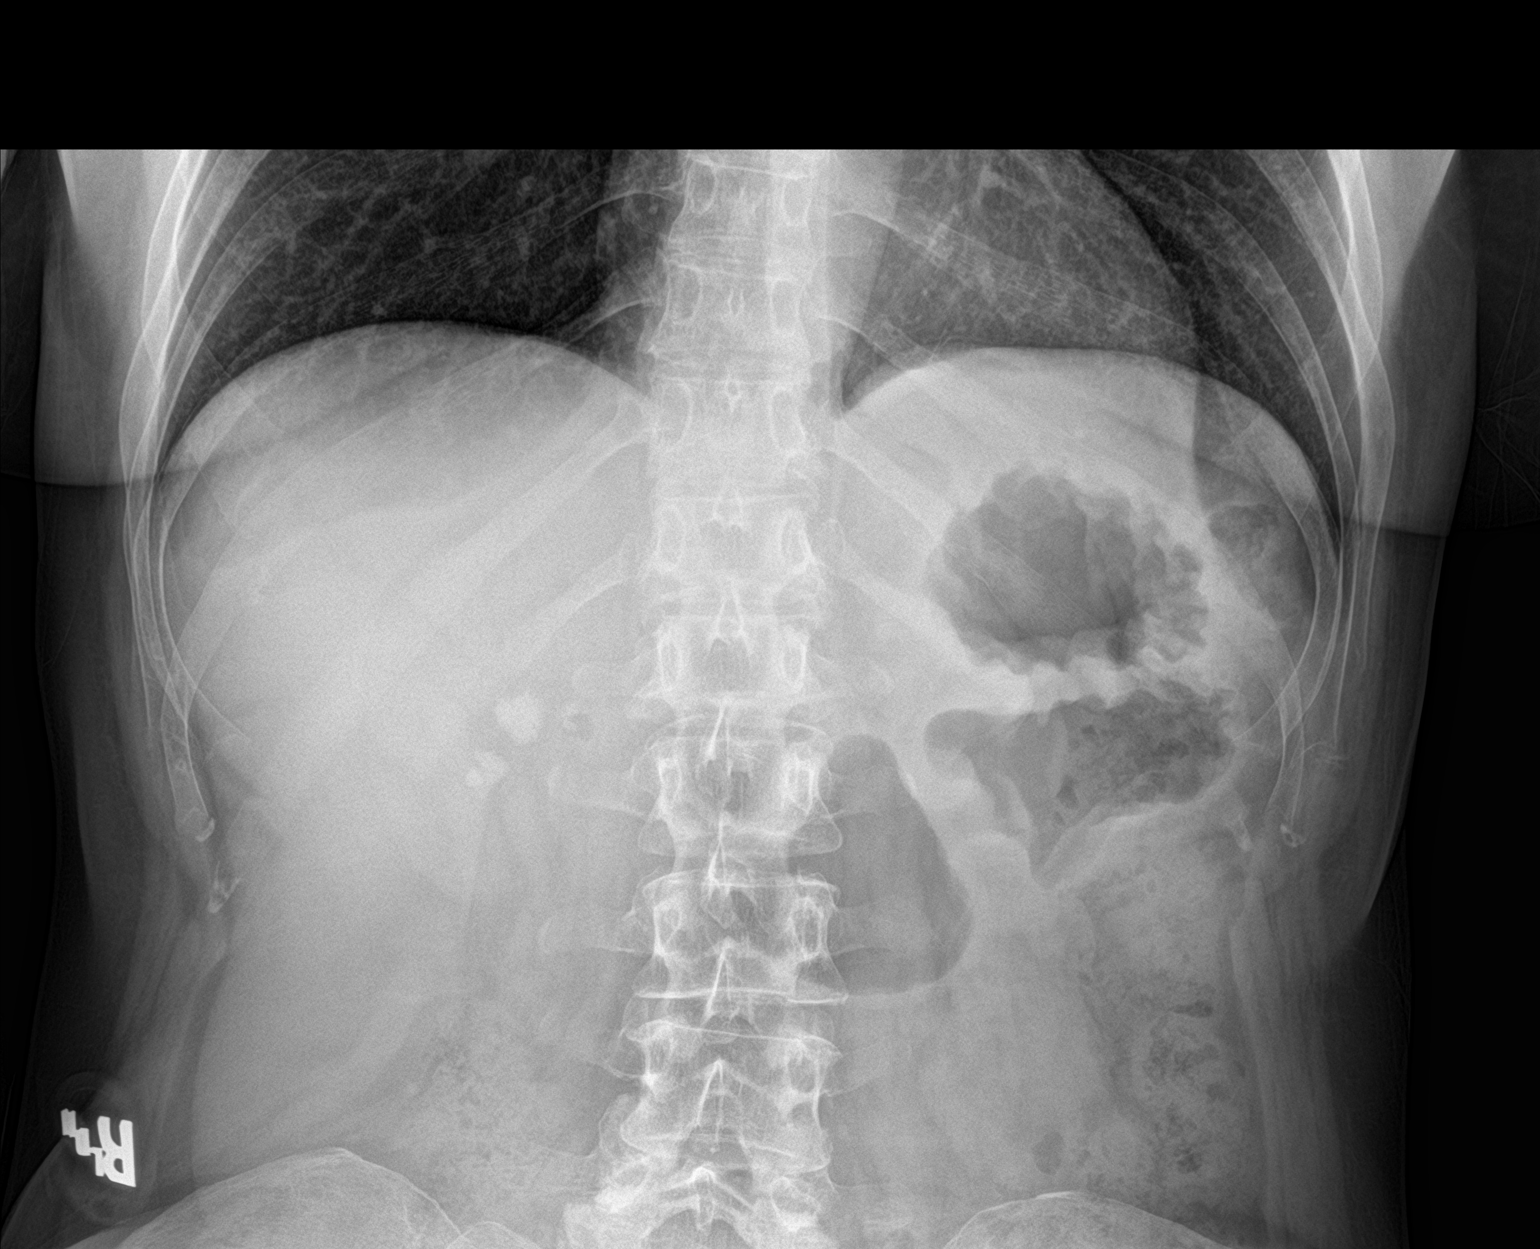
[im 2/2]
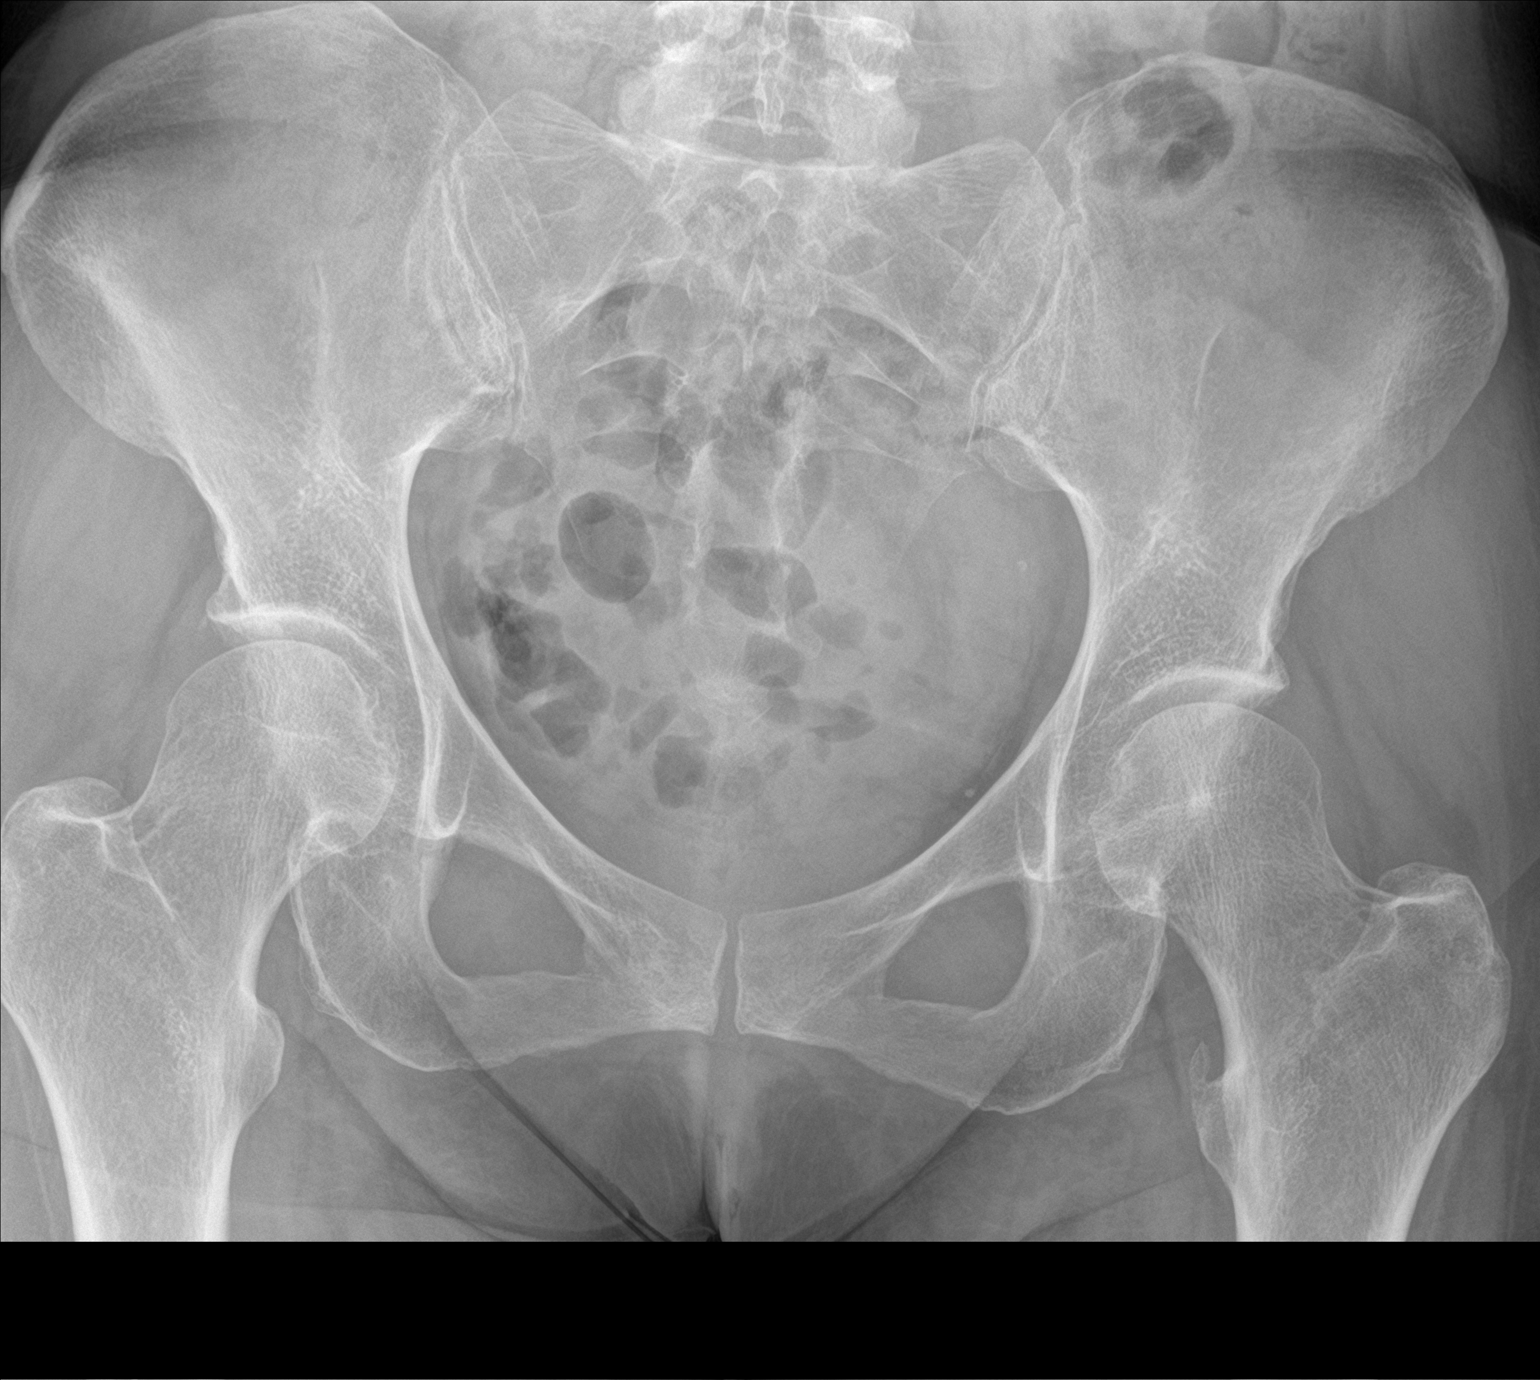

[2 of 2 positions shown; findings below may reference images not displayed]

FINDINGS: Right renal calculi measuring up to approximately 14 mm in size are
unchanged. No definite calculi are identified over the left kidney
or along the expected course of the right ureter, although overlying
bowel mildly limits assessment. A 5 mm ovoid calcific density
projecting over the left sacral ala is unchanged from the prior
study and is favored to represent left common iliac artery
atherosclerotic calcification when correlating with a 11/08/2014 CT
of the abdomen and pelvis rather than a ureteral stone.
IMPRESSION: Unchanged right nephrolithiasis.

## 2019-01-14 IMAGING — US US RENAL
1 series · 14 of 25 positions shown · non-contrast
Comparison: 09/19/2017 .

CLINICAL DATA: Nephrolithiasis.

EXAM:
RENAL / URINARY TRACT ULTRASOUND COMPLETE

[Series 1: us renal · 0.23mm/px · 14 of 48 slices shown]
[im 1/48]
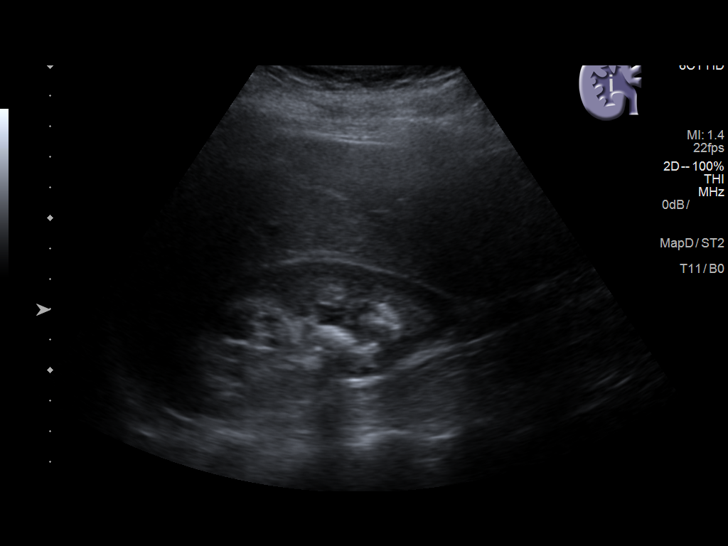
[im 4/48]
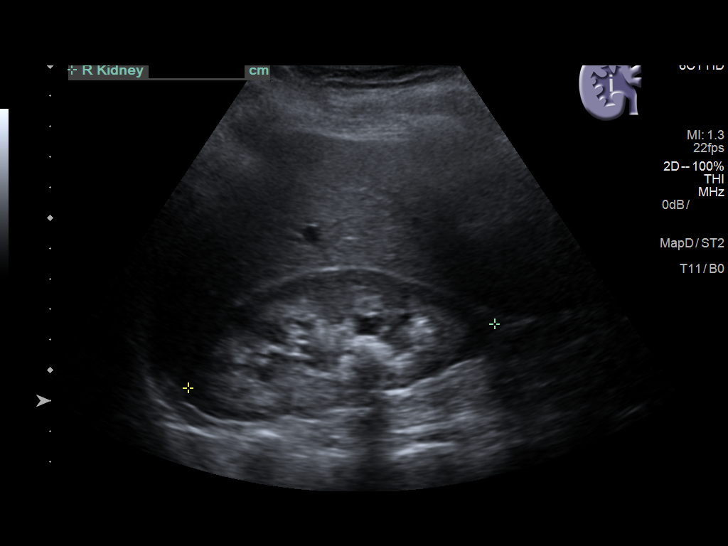
[im 8/48]
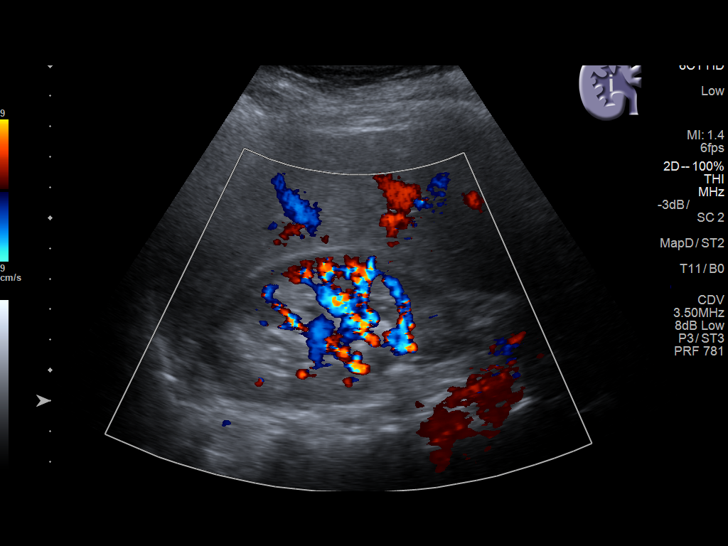
[im 12/48]
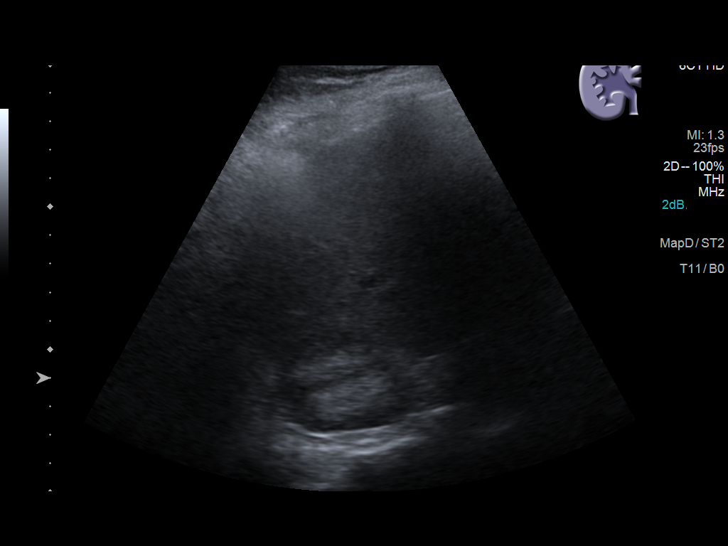
[im 16/48]
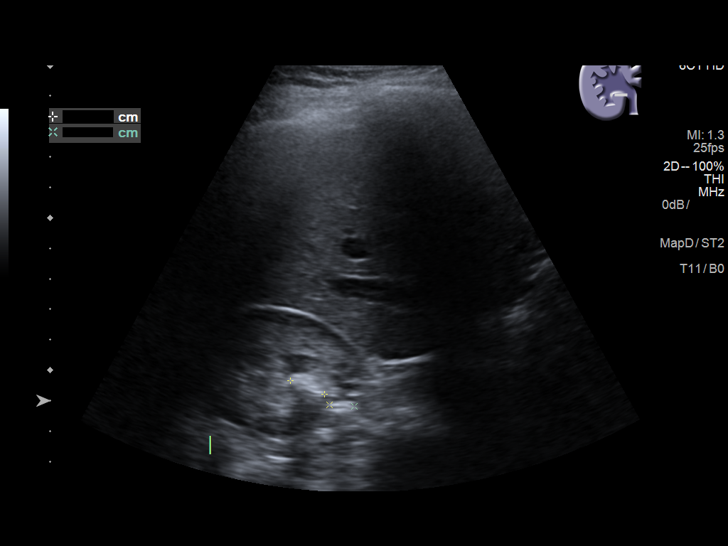
[im 18/48]
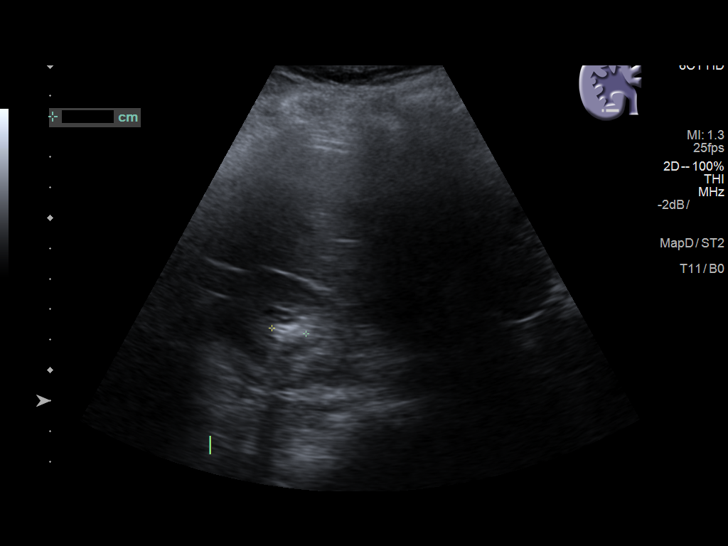
[im 22/48]
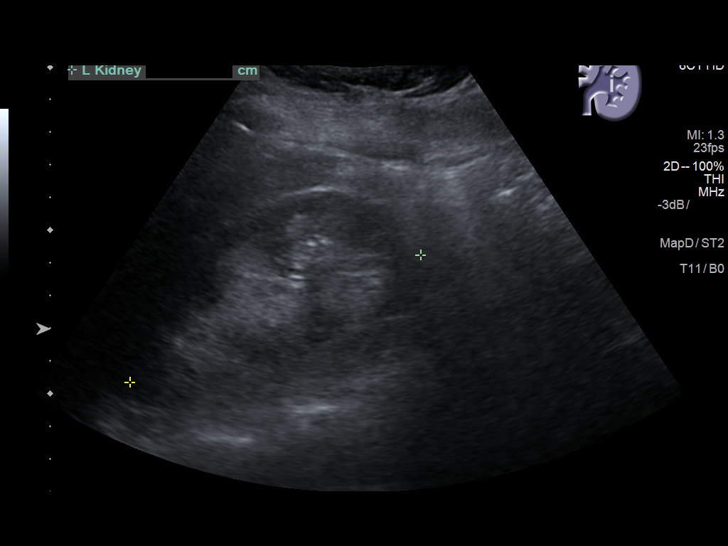
[im 26/48]
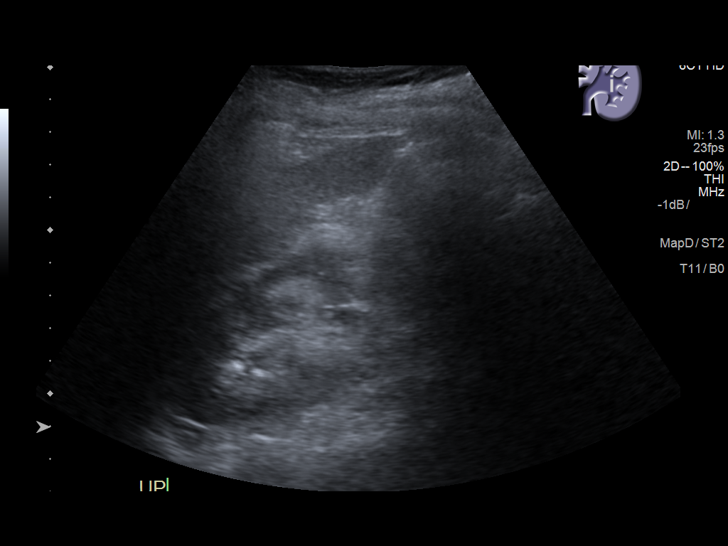
[im 30/48]
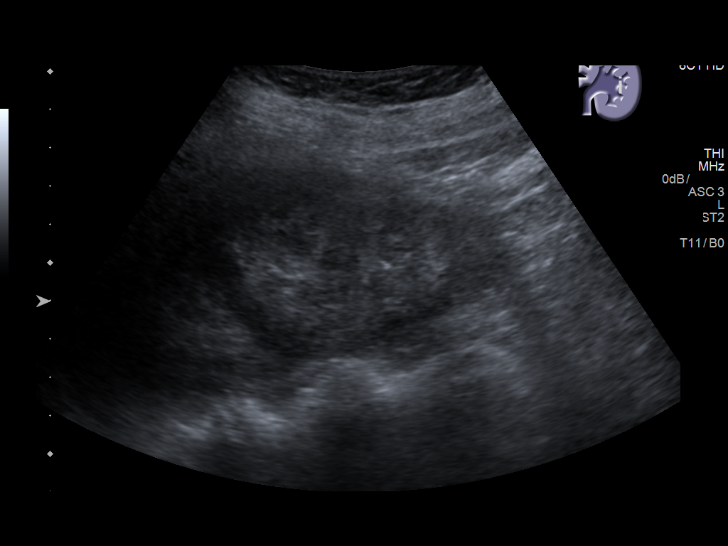
[im 32/48]
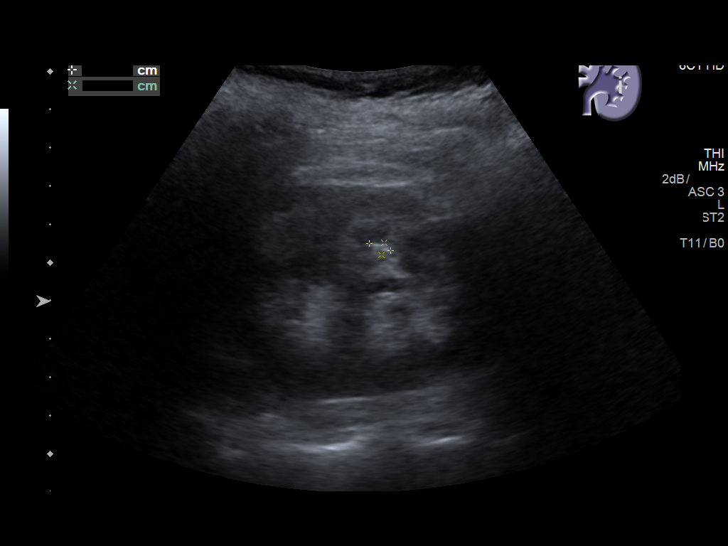
[im 36/48]
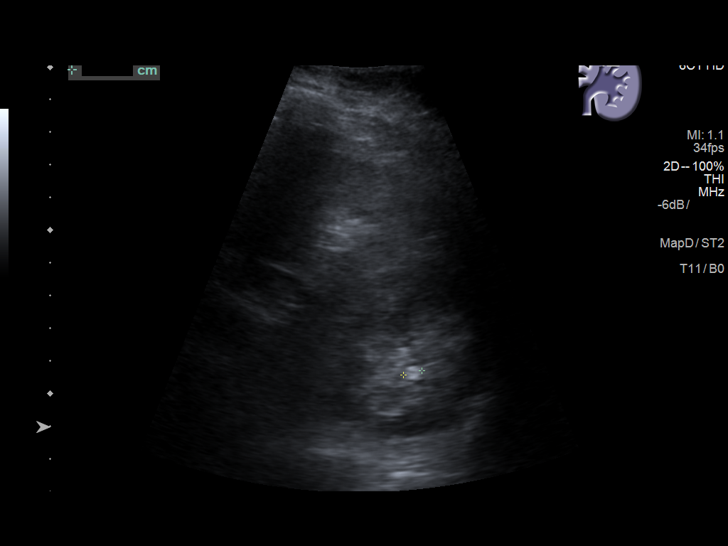
[im 40/48]
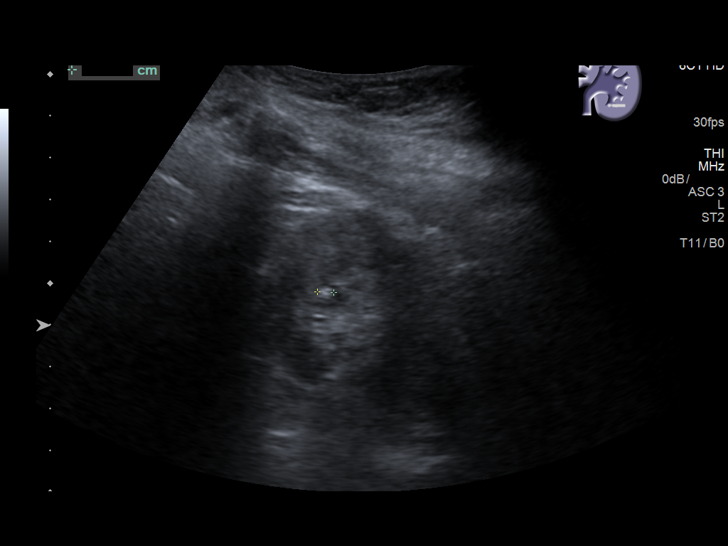
[im 44/48]
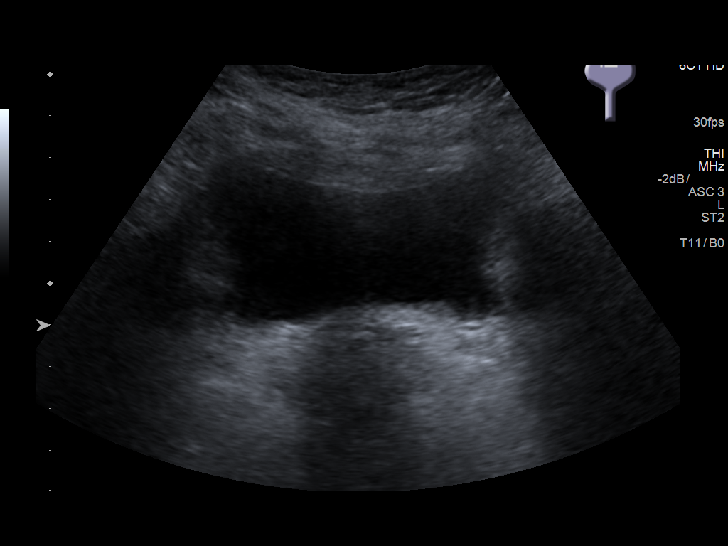
[im 48/48]
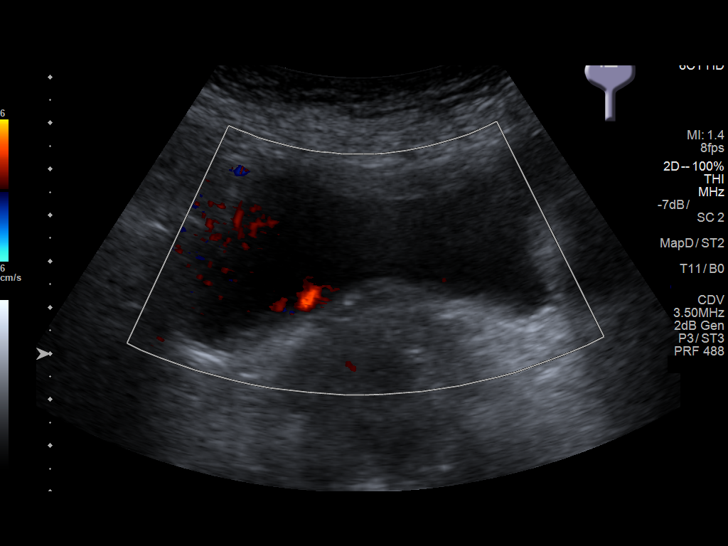

[14 of 25 positions shown; findings below may reference images not displayed]

FINDINGS: Right Kidney:

Length: 10.3 cm. Thinning of the renal cortex. Echogenicity within
normal limits. No mass or hydronephrosis visualized. Nonobstructing
calyceal stones, the largest measuring 1.3 cm

Left Kidney:

Length: 11.3 cm. Thinning of the renal cortex. Echogenicity within
normal limits. No mass or hydronephrosis visualized. Nonobstructing
left renal calyceal stones, the largest measuring 6 mm.

Bladder:

Appears normal for degree of bladder distention.
IMPRESSION: 1.  Bilateral renal cortical thinning.

2.  Bilateral nonobstructing renal stones.  No hydronephrosis.

## 2021-03-21 ENCOUNTER — Ambulatory Visit
Admission: RE | Admit: 2021-03-21 | Discharge: 2021-03-21 | Disposition: A | Discharge status: Home / Self Care | Payer: BC Managed Care – PPO | Source: Ambulatory Visit | Attending: Urology | Admitting: Urology

## 2021-03-21 ENCOUNTER — Other Ambulatory Visit: Payer: Self-pay

## 2021-03-21 ENCOUNTER — Ambulatory Visit: Payer: BC Managed Care – PPO | Admitting: Certified Registered"

## 2021-03-21 ENCOUNTER — Encounter: Payer: Self-pay | Admitting: Urology

## 2021-03-21 ENCOUNTER — Other Ambulatory Visit
Admission: RE | Admit: 2021-03-21 | Discharge: 2021-03-21 | Disposition: A | Discharge status: Home / Self Care | Payer: BC Managed Care – PPO | Source: Home / Self Care | Attending: Urology | Admitting: Urology

## 2021-03-21 ENCOUNTER — Ambulatory Visit: Payer: BC Managed Care – PPO | Admitting: Urology

## 2021-03-21 ENCOUNTER — Ambulatory Visit
Admission: RE | Admit: 2021-03-21 | Discharge: 2021-03-21 | Disposition: A | Discharge status: Home / Self Care | Payer: BC Managed Care – PPO | Source: Home / Self Care | Attending: Urology | Admitting: Urology

## 2021-03-21 ENCOUNTER — Encounter
Admission: RE | Disposition: A | Discharge status: Home / Self Care | Payer: Self-pay | Source: Ambulatory Visit | Attending: Urology

## 2021-03-21 ENCOUNTER — Ambulatory Visit: Payer: BC Managed Care – PPO

## 2021-03-21 VITALS — BP 139/87 | HR 111 | Temp 98.9°F | Ht 68.0 in | Wt 177.0 lb

## 2021-03-21 DIAGNOSIS — Z87442 Personal history of urinary calculi: Secondary | ICD-10-CM | POA: Diagnosis not present

## 2021-03-21 DIAGNOSIS — N132 Hydronephrosis with renal and ureteral calculous obstruction: Secondary | ICD-10-CM | POA: Insufficient documentation

## 2021-03-21 DIAGNOSIS — N200 Calculus of kidney: Secondary | ICD-10-CM

## 2021-03-21 DIAGNOSIS — N201 Calculus of ureter: Secondary | ICD-10-CM

## 2021-03-21 DIAGNOSIS — N23 Unspecified renal colic: Secondary | ICD-10-CM

## 2021-03-21 DIAGNOSIS — N3000 Acute cystitis without hematuria: Secondary | ICD-10-CM

## 2021-03-21 DIAGNOSIS — N1330 Unspecified hydronephrosis: Secondary | ICD-10-CM | POA: Diagnosis not present

## 2021-03-21 HISTORY — PX: CYSTOSCOPY WITH STENT PLACEMENT: SHX5790

## 2021-03-21 LAB — CBC WITH DIFFERENTIAL/PLATELET
Abs Immature Granulocytes: 0.04 10*3/uL (ref 0.00–0.07)
Basophils Absolute: 0.1 10*3/uL (ref 0.0–0.1)
Basophils Relative: 1 %
Eosinophils Absolute: 0.2 10*3/uL (ref 0.0–0.5)
Eosinophils Relative: 1 %
HCT: 43.3 % (ref 36.0–46.0)
Hemoglobin: 14.8 g/dL (ref 12.0–15.0)
Immature Granulocytes: 0 %
Lymphocytes Relative: 15 %
Lymphs Abs: 2.1 10*3/uL (ref 0.7–4.0)
MCH: 31.6 pg (ref 26.0–34.0)
MCHC: 34.2 g/dL (ref 30.0–36.0)
MCV: 92.5 fL (ref 80.0–100.0)
Monocytes Absolute: 1.1 10*3/uL — ABNORMAL HIGH (ref 0.1–1.0)
Monocytes Relative: 7 %
Neutro Abs: 11.2 10*3/uL — ABNORMAL HIGH (ref 1.7–7.7)
Neutrophils Relative %: 76 %
Platelets: 303 10*3/uL (ref 150–400)
RBC: 4.68 MIL/uL (ref 3.87–5.11)
RDW: 12.3 % (ref 11.5–15.5)
WBC: 14.7 10*3/uL — ABNORMAL HIGH (ref 4.0–10.5)
nRBC: 0 % (ref 0.0–0.2)

## 2021-03-21 LAB — URINALYSIS, COMPLETE (UACMP) WITH MICROSCOPIC: RBC / HPF: >50 RBC/hpf (ref 0–5)

## 2021-03-21 LAB — BASIC METABOLIC PANEL
Anion gap: 9 (ref 5–15)
BUN: 18 mg/dL (ref 6–20)
CO2: 22 mmol/L (ref 22–32)
Calcium: 9.5 mg/dL (ref 8.9–10.3)
Chloride: 108 mmol/L (ref 98–111)
Creatinine, Ser: 0.76 mg/dL (ref 0.44–1.00)
GFR, Estimated: >60 mL/min (ref >60)
Glucose, Bld: 102 mg/dL — ABNORMAL HIGH (ref 70–99)
Potassium: 3.7 mmol/L (ref 3.5–5.1)
Sodium: 139 mmol/L (ref 135–145)

## 2021-03-21 SURGERY — CYSTOSCOPY, WITH STENT INSERTION
Anesthesia: General | Site: Ureter | Laterality: Left

## 2021-03-21 MED ORDER — FENTANYL CITRATE (PF) 100 MCG/2ML IJ SOLN: 25.0000 ug | INTRAMUSCULAR | Status: DC | PRN

## 2021-03-21 MED ORDER — ONDANSETRON HCL 4 MG/2ML IJ SOLN: 4.0000 mg | Freq: Once | INTRAMUSCULAR | Status: AC

## 2021-03-21 MED ORDER — ONDANSETRON HCL 4 MG/2ML IJ SOLN
INTRAMUSCULAR | Status: AC
  Administered 2021-03-21: 4 mg via INTRAVENOUS
  Filled 2021-03-21: qty 2

## 2021-03-21 MED ORDER — PROPOFOL 10 MG/ML IV BOLUS
INTRAVENOUS | Status: DC | PRN
  Administered 2021-03-21: 170 mg via INTRAVENOUS

## 2021-03-21 MED ORDER — ORAL CARE MOUTH RINSE: 15.0000 mL | Freq: Once | OROMUCOSAL | Status: AC

## 2021-03-21 MED ORDER — FENTANYL CITRATE (PF) 100 MCG/2ML IJ SOLN
INTRAMUSCULAR | Status: AC
  Filled 2021-03-21: qty 2

## 2021-03-21 MED ORDER — OXYBUTYNIN CHLORIDE 5 MG PO TABS: ORAL_TABLET | ORAL | 0 refills | Status: DC

## 2021-03-21 MED ORDER — KETOROLAC TROMETHAMINE 60 MG/2ML IM SOLN
15.0000 mg | Freq: Once | INTRAMUSCULAR | Status: AC
  Administered 2021-03-21: 15 mg via INTRAMUSCULAR

## 2021-03-21 MED ORDER — IOHEXOL 180 MG/ML  SOLN
INTRAMUSCULAR | Status: DC | PRN
  Administered 2021-03-21: 5 mL

## 2021-03-21 MED ORDER — CEFTRIAXONE SODIUM 500 MG IJ SOLR
1000.0000 mg | Freq: Once | INTRAMUSCULAR | Status: AC
  Administered 2021-03-21: 1000 mg via INTRAMUSCULAR

## 2021-03-21 MED ORDER — ROCURONIUM BROMIDE 100 MG/10ML IV SOLN
INTRAVENOUS | Status: DC | PRN
  Administered 2021-03-21: 50 mg via INTRAVENOUS

## 2021-03-21 MED ORDER — LIDOCAINE HCL (CARDIAC) PF 100 MG/5ML IV SOSY
PREFILLED_SYRINGE | INTRAVENOUS | Status: DC | PRN
  Administered 2021-03-21: 80 mg via INTRAVENOUS

## 2021-03-21 MED ORDER — CEFUROXIME AXETIL 500 MG PO TABS: 500.0000 mg | ORAL_TABLET | Freq: Two times a day (BID) | ORAL | 0 refills | Status: AC

## 2021-03-21 MED ORDER — FENTANYL CITRATE (PF) 100 MCG/2ML IJ SOLN
INTRAMUSCULAR | Status: DC | PRN
  Administered 2021-03-21: 50 ug via INTRAVENOUS

## 2021-03-21 MED ORDER — LACTATED RINGERS IV SOLN: INTRAVENOUS | Status: DC

## 2021-03-21 MED ORDER — CHLORHEXIDINE GLUCONATE 0.12 % MT SOLN
OROMUCOSAL | Status: AC
  Filled 2021-03-21: qty 15

## 2021-03-21 MED ORDER — OXYCODONE-ACETAMINOPHEN 5-325 MG PO TABS: 1.0000 | ORAL_TABLET | Freq: Four times a day (QID) | ORAL | 0 refills | Status: DC | PRN

## 2021-03-21 MED ORDER — DEXAMETHASONE SODIUM PHOSPHATE 10 MG/ML IJ SOLN
INTRAMUSCULAR | Status: DC | PRN
  Administered 2021-03-21: 8 mg via INTRAVENOUS

## 2021-03-21 MED ORDER — CHLORHEXIDINE GLUCONATE 0.12 % MT SOLN
15.0000 mL | Freq: Once | OROMUCOSAL | Status: AC
  Administered 2021-03-21: 15 mL via OROMUCOSAL

## 2021-03-21 MED ORDER — PROMETHAZINE HCL 25 MG/ML IJ SOLN
INTRAMUSCULAR | Status: AC
  Administered 2021-03-21: 6.25 mg via INTRAMUSCULAR
  Administered 2021-03-21: 6.25 mg
  Filled 2021-03-21: qty 1

## 2021-03-21 MED ORDER — ONDANSETRON HCL 4 MG/2ML IJ SOLN: 4.0000 mg | Freq: Once | INTRAMUSCULAR | Status: AC | PRN

## 2021-03-21 MED ORDER — TAMSULOSIN HCL 0.4 MG PO CAPS: 0.4000 mg | ORAL_CAPSULE | Freq: Every day | ORAL | 0 refills | Status: DC

## 2021-03-21 MED ORDER — MIDAZOLAM HCL 2 MG/2ML IJ SOLN
INTRAMUSCULAR | Status: DC | PRN
  Administered 2021-03-21: 2 mg via INTRAVENOUS

## 2021-03-21 MED ORDER — MIDAZOLAM HCL 2 MG/2ML IJ SOLN
INTRAMUSCULAR | Status: AC
  Filled 2021-03-21: qty 2

## 2021-03-21 MED ORDER — SUGAMMADEX SODIUM 200 MG/2ML IV SOLN
INTRAVENOUS | Status: DC | PRN
  Administered 2021-03-21 (×2): 200 mg via INTRAVENOUS

## 2021-03-21 MED ORDER — PHENYLEPHRINE HCL (PRESSORS) 10 MG/ML IV SOLN
INTRAVENOUS | Status: DC | PRN
  Administered 2021-03-21 (×2): 100 ug via INTRAVENOUS

## 2021-03-21 MED ORDER — PROPOFOL 500 MG/50ML IV EMUL
INTRAVENOUS | Status: DC | PRN
  Administered 2021-03-21: 50 ug/kg/min via INTRAVENOUS

## 2021-03-21 SURGICAL SUPPLY — 18 items
BAG DRAIN CYSTO-URO LG1000N (MISCELLANEOUS) ×2 IMPLANT
BRUSH SCRUB EZ 1% IODOPHOR (MISCELLANEOUS) ×2 IMPLANT
CATH URETL 5X70 OPEN END (CATHETERS) ×2 IMPLANT
GLOVE SURG ENC MOIS LTX SZ6.5 (GLOVE) ×2 IMPLANT
GOWN STRL REUS W/ TWL LRG LVL3 (GOWN DISPOSABLE) ×2 IMPLANT
GOWN STRL REUS W/TWL LRG LVL3 (GOWN DISPOSABLE) ×4
GUIDEWIRE STR DUAL SENSOR (WIRE) ×4 IMPLANT
IV NS IRRIG 3000ML ARTHROMATIC (IV SOLUTION) ×2 IMPLANT
KIT TURNOVER CYSTO (KITS) ×2 IMPLANT
PACK CYSTO AR (MISCELLANEOUS) ×2 IMPLANT
SET CYSTO W/LG BORE CLAMP LF (SET/KITS/TRAYS/PACK) ×2 IMPLANT
STENT URET 6FRX24 CONTOUR (STENTS) ×2 IMPLANT
STENT URET 6FRX26 CONTOUR (STENTS) IMPLANT
SURGILUBE 2OZ TUBE FLIPTOP (MISCELLANEOUS) ×2 IMPLANT
SYR 10ML LL (SYRINGE) ×2 IMPLANT
SYR TOOMEY IRRIG 70ML (MISCELLANEOUS) ×2
SYRINGE TOOMEY IRRIG 70ML (MISCELLANEOUS) ×1 IMPLANT
WATER STERILE IRR 1000ML POUR (IV SOLUTION) ×2 IMPLANT

## 2021-03-21 NOTE — H&P (View-Only) (Signed)
03/21/21 2:51 PM   Brittney Andrews [birth year] [id number]  CC: Left flank pain  HPI: I saw Brittney Andrews as an add-on in urology clinic today for evaluation of left flank pain.  She is a healthy 60 year old female with history of recurrent nephrolithiasis requiring shockwave, ureteroscopy, and PCNL in the past.  She has done well over the last 3 to 4 years.  She reports 2 days of severe left flank pain and nausea.  She denies any fevers or chills at home.  I personally reviewed her KUB today that suggest a 1 cm left proximal ureteral stone.  UA suspicious for infection with 0-5 squamous cells, 20-50 WBCs, greater than 50 RBCs, many bacteria, calcium oxalate crystals present.  Will send for culture.   PMH: Past Medical History:  Diagnosis Date  . Diverticulitis   . History of kidney stones 08/2017   right nephrolithiasis, left renal colic    Surgical History: Past Surgical History:  Procedure Laterality Date  . ABDOMINAL HYSTERECTOMY    . BREAST SURGERY     biopsy  . COLONOSCOPY    . CYSTOSCOPY Left 12/05/2016   stent placement  . EXTRACORPOREAL SHOCK WAVE LITHOTRIPSY Left 09/05/2017   Procedure: EXTRACORPOREAL SHOCK WAVE LITHOTRIPSY (ESWL);  Surgeon: Hollice Espy, MD;  Location: [facility] ORS;  Service: Urology;  Laterality: Left;  . EXTRACORPOREAL SHOCK WAVE LITHOTRIPSY Right 10/17/2017   Procedure: EXTRACORPOREAL SHOCK WAVE LITHOTRIPSY (ESWL);  Surgeon: Abbie Sons, MD;  Location: [facility] ORS;  Service: Urology;  Laterality: Right;  . URETEROLITHOTOMY Left 11/2016    Family History: Family History  Problem Relation Age of Onset  . Heart Problems Father   . Healthy Mother     Social History:  reports that she has never smoked. She has never used smokeless tobacco. She reports that she does not drink alcohol and does not use drugs.  Physical Exam: BP 139/87   Pulse (!) 111   Temp 98.9 F (37.2 C) (Oral)   Ht 5\' 8"  (1.727 m)   Wt 177 lb (80.3 kg)   BMI 26.91  kg/m    Constitutional: Uncomfortable, in pain Cardiovascular: Tachycardic, regular rhythm Respiratory: Clear to auscultation bilaterally GI: Abdomen is soft, nontender, nondistended, no abdominal masses GU: Left CVA tenderness  Laboratory Data: Reviewed, see HPI  Pertinent Imaging: I have personally viewed and interpreted the KUB showing a 1 cm left proximal ureteral stone.  Assessment & Plan:   60 year old female with severe left renal colic likely secondary to a 1 cm left proximal ureteral stone based on KUB today.  Urinalysis today also grossly concerning for infection.  Ceftriaxone and Toradol given in clinic today.  We discussed the need for drainage in the setting of an infected and obstructed system.  A ureteral stent is a small plastic tube that is placed cystoscopically with one end in the kidney and the other end in the bladder that allows the infection from the kidney to drain, and relieves pain from the obstructing stone.  We discussed the risks at length including bleeding, infection, sepsis, death, ureteral injury, and stent related symptoms including urgency/frequency/dysuria/flank pain/gross hematuria.  There is a low, but not 0, risk of inability to pass the ureteral stent alongside the stone from below which would require percutaneous nephrostomy tube by interventional radiology.  Finally, we discussed possible prolonged hospitalization and recovery, possible temporary Foley catheter placement, and 10 to 14-day course of antibiotics.  We reviewed the need for a follow-up procedure for definitive management of  their stone when the infection has been treated in 2 to 3 weeks with either ureteroscopy/laser lithotripsy or shockwave lithotripsy.  I discussed her case with Dr. Erlene Quan who is on-call today, and she was able to add her to the OR schedule for a cystoscopy and left ureteral stent at this afternoon.  She will have a friend drive her to [facility] this afternoon with plan for  left ureteral stent placement and possible admission.  Nickolas Madrid, MD 03/21/2021  [facility] [address] [place], [place] [address] [phone]

## 2021-03-21 NOTE — Progress Notes (Signed)
03/21/21 2:51 PM   Brittney Andrews [birth year] [id number]  CC: Left flank pain  HPI: I saw Brittney Andrews as an add-on in urology clinic today for evaluation of left flank pain.  She is a healthy 60 year old female with history of recurrent nephrolithiasis requiring shockwave, ureteroscopy, and PCNL in the past.  She has done well over the last 3 to 4 years.  She reports 2 days of severe left flank pain and nausea.  She denies any fevers or chills at home.  I personally reviewed her KUB today that suggest a 1 cm left proximal ureteral stone.  UA suspicious for infection with 0-5 squamous cells, 20-50 WBCs, greater than 50 RBCs, many bacteria, calcium oxalate crystals present.  Will send for culture.   PMH: Past Medical History:  Diagnosis Date  . Diverticulitis   . History of kidney stones 08/2017   right nephrolithiasis, left renal colic    Surgical History: Past Surgical History:  Procedure Laterality Date  . ABDOMINAL HYSTERECTOMY    . BREAST SURGERY     biopsy  . COLONOSCOPY    . CYSTOSCOPY Left 12/05/2016   stent placement  . EXTRACORPOREAL SHOCK WAVE LITHOTRIPSY Left 09/05/2017   Procedure: EXTRACORPOREAL SHOCK WAVE LITHOTRIPSY (ESWL);  Surgeon: Hollice Espy, MD;  Location: [facility] ORS;  Service: Urology;  Laterality: Left;  . EXTRACORPOREAL SHOCK WAVE LITHOTRIPSY Right 10/17/2017   Procedure: EXTRACORPOREAL SHOCK WAVE LITHOTRIPSY (ESWL);  Surgeon: Abbie Sons, MD;  Location: [facility] ORS;  Service: Urology;  Laterality: Right;  . URETEROLITHOTOMY Left 11/2016    Family History: Family History  Problem Relation Age of Onset  . Heart Problems Father   . Healthy Mother     Social History:  reports that she has never smoked. She has never used smokeless tobacco. She reports that she does not drink alcohol and does not use drugs.  Physical Exam: BP 139/87   Pulse (!) 111   Temp 98.9 F (37.2 C) (Oral)   Ht 5\' 8"  (1.727 m)   Wt 177 lb (80.3 kg)   BMI 26.91  kg/m    Constitutional: Uncomfortable, in pain Cardiovascular: Tachycardic, regular rhythm Respiratory: Clear to auscultation bilaterally GI: Abdomen is soft, nontender, nondistended, no abdominal masses GU: Left CVA tenderness  Laboratory Data: Reviewed, see HPI  Pertinent Imaging: I have personally viewed and interpreted the KUB showing a 1 cm left proximal ureteral stone.  Assessment & Plan:   60 year old female with severe left renal colic likely secondary to a 1 cm left proximal ureteral stone based on KUB today.  Urinalysis today also grossly concerning for infection.  Ceftriaxone and Toradol given in clinic today.  We discussed the need for drainage in the setting of an infected and obstructed system.  A ureteral stent is a small plastic tube that is placed cystoscopically with one end in the kidney and the other end in the bladder that allows the infection from the kidney to drain, and relieves pain from the obstructing stone.  We discussed the risks at length including bleeding, infection, sepsis, death, ureteral injury, and stent related symptoms including urgency/frequency/dysuria/flank pain/gross hematuria.  There is a low, but not 0, risk of inability to pass the ureteral stent alongside the stone from below which would require percutaneous nephrostomy tube by interventional radiology.  Finally, we discussed possible prolonged hospitalization and recovery, possible temporary Foley catheter placement, and 10 to 14-day course of antibiotics.  We reviewed the need for a follow-up procedure for definitive management of  their stone when the infection has been treated in 2 to 3 weeks with either ureteroscopy/laser lithotripsy or shockwave lithotripsy.  I discussed her case with Dr. Erlene Quan who is on-call today, and she was able to add her to the OR schedule for a cystoscopy and left ureteral stent at this afternoon.  She will have a friend drive her to [facility] this afternoon with plan for  left ureteral stent placement and possible admission.  Nickolas Madrid, MD 03/21/2021  [facility] [address] [place], [place] [address] [phone]

## 2021-03-21 NOTE — Transfer of Care (Signed)
Immediate Anesthesia Transfer of Care Note  Patient: Brittney Andrews  Procedure(s) Performed: CYSTOSCOPY WITH STENT PLACEMENT (Left Ureter)  Patient Location: PACU  Anesthesia Type:General  Level of Consciousness: awake, oriented, drowsy and patient cooperative  Airway & Oxygen Therapy: Patient Spontanous Breathing  Post-op Assessment: Report given to RN and Post -op Vital signs reviewed and stable  Post vital signs: Reviewed and stable  Last Vitals:  Vitals Value Taken Time  BP 124/109 03/21/21 1745  Temp    Pulse 94 03/21/21 1748  Resp 23 03/21/21 1748  SpO2 96 % 03/21/21 1748  Vitals shown include unvalidated device data.  Last Pain:  Vitals:   03/21/21 1554  TempSrc: Temporal  PainSc: 0-No pain         Complications: No complications documented.

## 2021-03-21 NOTE — Op Note (Signed)
Preoperative diagnosis:  1. Left proximal ureteral calculus with renal colic  Postoperative diagnosis:  1. Same  Procedure:  1. Cystoscopy 2. Left ureteral stent placement (6FR/24 cm) 3. Left retrograde pyelography with interpretation  Surgeon: Nicki Reaper C. Cristen Murcia, M.D.  Anesthesia: General  Complications: None  Intraoperative findings:  1. Cystoscopy-bladder mucosa normal in appearance without erythema, solid or papillary lesions.  Blood-tinged efflux left UO 2. Left retrograde pyelogram-normal-appearing distal mid and lower proximal ureter.  Filling defect left proximal ureter with hydroureter and mild hydronephrosis and consistent with proximal ureteral calculus  EBL: Minimal  Specimens: Urine left renal pelvis for culture  Indication: Brittney Andrews is a 60 y.o. female with a history of recurrent stone disease.  She saw Dr. Diamantina Providence and the [facility] office this afternoon with a 2-day history of severe left flank pain and nausea.  Denied fever or chills.  UA showed >50 RBCs and 20-50 WBCs with many bacteria.  KUB showed a 1 cm calcification suspicious for proximal ureteral stone.  She received Rocephin in the office and presents for stent placement.  After reviewing the management options for treatment, he elected to proceed with the above surgical procedure(s). We have discussed the potential benefits and risks of the procedure, side effects of the proposed treatment, the likelihood of the patient achieving the goals of the procedure, and any potential problems that might occur during the procedure or recuperation. Informed consent has been obtained.  Description of procedure:  The patient was taken to the operating room and general anesthesia was induced.  The patient was placed in the dorsal lithotomy position, prepped and draped in the usual sterile fashion, and preoperative antibiotics were administered. A preoperative time-out was performed.   A 21 French cystoscope with  obturator was lubricated and passed per urethra.  Panendoscopy was performed with findings as described above.  Attention then turned to the left ureteral orifice and a ureteral catheter was used to intubate the ureteral orifice.  Omnipaque contrast was injected through the ureteral catheter and a retrograde pyelogram was performed with findings as dictated above.  A 0.038 Sensor wire was then placed through the ureteral catheter under fluoroscopic guidance into the renal pelvis.  The ureteral catheter was advanced over the guidewire and after guidewire removal 10 mL of blood-tinged, clear urine was aspirated and sent for culture.  The guidewire was replaced and the ureteral catheter was removed.  A 6FR/24 cm Contour ureteral stent was then advanced over the guidewire under fluoroscopic guidance.  Partial curl noted in the renal pelvis which was felt will achieve complete curl passively.  The distal stent in was well-positioned.  The bladder was then emptied and the procedure ended.  The patient appeared to tolerate the procedure well and without complications.  After anesthetic reversal she was transported to the PACU in stable condition.  Plan: 1. If vital signs stable and no temp spike she will be discharged this evening 2. She will be contacted to schedule definitive stone treatment once urine culture results are reviewed   [name], MD

## 2021-03-21 NOTE — Anesthesia Preprocedure Evaluation (Signed)
Anesthesia Evaluation  Patient identified by MRN, date of birth, ID band Patient awake    Reviewed: Allergy & Precautions, NPO status , Patient's Chart, lab work & pertinent test results  Airway Mallampati: II  TM Distance: >3 FB     Dental  (+) Teeth Intact   Pulmonary neg pulmonary ROS,    Pulmonary exam normal        Cardiovascular negative cardio ROS Normal cardiovascular exam     Neuro/Psych negative neurological ROS  negative psych ROS   GI/Hepatic Neg liver ROS, diverticulitis   Endo/Other  negative endocrine ROS  Renal/GU stones  negative genitourinary   Musculoskeletal negative musculoskeletal ROS (+)   Abdominal Normal abdominal exam  (+)   Peds negative pediatric ROS (+)  Hematology negative hematology ROS (+)   Anesthesia Other Findings Past Medical History: No date: Diverticulitis 08/2017: History of kidney stones     Comment:  right nephrolithiasis, left renal colic  Reproductive/Obstetrics                             Anesthesia Physical Anesthesia Plan  ASA: II  Anesthesia Plan: General   Post-op Pain Management:    Induction: Intravenous  PONV Risk Score and Plan:   Airway Management Planned: Oral ETT  Additional Equipment:   Intra-op Plan:   Post-operative Plan: Extubation in OR  Informed Consent: I have reviewed the patients History and Physical, chart, labs and discussed the procedure including the risks, benefits and alternatives for the proposed anesthesia with the patient or authorized representative who has indicated his/her understanding and acceptance.     Dental advisory given  Plan Discussed with: CRNA and Surgeon  Anesthesia Plan Comments:         Anesthesia Quick Evaluation

## 2021-03-21 NOTE — Patient Instructions (Signed)
Ureteral Stent Implantation  Ureteral stent implantation is a procedure to insert (implant) a flexible, soft, plastic tube (stent) into a ureter. Ureters are the tube-like parts of the body that drain urine from the kidneys. The stent supports the ureter while it heals and helps to drain urine. You may have a ureteral stent implanted after having a procedure to remove a blockage from the ureter (ureterolysis or pyeloplasty). You may also have a stent implanted to open the flow of urine when you have a blockage caused by a kidney stone, tumor, blood clot, or infection. You have two ureters, one on each side of the body. The ureters connect the kidneys to the organ that holds urine until it passes out of the body (bladder). The stent is placed so that one end is in the kidney, and one end is in the bladder. The stent is usually taken out after your ureter has healed. Depending on your condition, you may have a stent for just a few weeks, or you may have a long-term stent that will need to be replaced every few months. Tell a health care provider about:  Any allergies you have.  All medicines you are taking, including vitamins, herbs, eye drops, creams, and over-the-counter medicines.  Any problems you or family members have had with anesthetic medicines.  Any blood disorders you have.  Any surgeries you have had.  Any medical conditions you have.  Whether you are pregnant or may be pregnant. What are the risks? Generally, this is a safe procedure. However, problems may occur, including:  Infection.  Bleeding.  Allergic reactions to medicines.  Damage to other structures or organs. Tearing (perforation) of the ureter is possible.  Movement of the stent away from where it is placed during surgery (migration). What happens before the procedure? Medicines Ask your health care provider about:  Changing or stopping your regular medicines. This is especially important if you are taking  diabetes medicines or blood thinners.  Taking medicines such as aspirin and ibuprofen. These medicines can thin your blood. Do not take these medicines unless your health care provider tells you to take them.  Taking over-the-counter medicines, vitamins, herbs, and supplements. Eating and drinking Follow instructions from your health care provider about eating and drinking, which may include:  8 hours before the procedure - stop eating heavy meals or foods, such as meat, fried foods, or fatty foods.  6 hours before the procedure - stop eating light meals or foods, such as toast or cereal.  6 hours before the procedure - stop drinking milk or drinks that contain milk.  2 hours before the procedure - stop drinking clear liquids. Staying hydrated Follow instructions from your health care provider about hydration, which may include:  Up to 2 hours before the procedure - you may continue to drink clear liquids, such as water, clear fruit juice, black coffee, and plain tea. General instructions  Do not drink alcohol.  Do not use any products that contain nicotine or tobacco for at least 4 weeks before the procedure. These products include cigarettes, e-cigarettes, and chewing tobacco. If you need help quitting, ask your health care provider.  You may have an exam or testing, such as imaging or blood tests.  Ask your health care provider what steps will be taken to help prevent infection. These may include: ? Removing hair at the surgery site. ? Washing skin with a germ-killing soap. ? Taking antibiotic medicine.  Plan to have someone take you home   from the hospital or clinic.  If you will be going home right after the procedure, plan to have someone with you for 24 hours. What happens during the procedure?  An IV will be inserted into one of your veins.  You may be given a medicine to help you relax (sedative).  You may be given a medicine to make you fall asleep (general  anesthetic).  A thin, tube-shaped instrument with a light and tiny camera at the end (cystoscope) will be inserted into your urethra. The urethra is the tube that drains urine from the bladder out of the body. In men, the urethra opens at the end of the penis. In women, the urethra opens in front of the vaginal opening.  The cystoscope will be passed into your bladder.  A thin wire (guide wire) will be passed through your bladder and into your ureter. This is used to guide the stent into your ureter.  The stent will be inserted into your ureter.  The guide wire and the cystoscope will be removed.  A flexible tube (catheter) may be inserted through your urethra so that one end is in your bladder. This helps to drain urine from your bladder. The procedure may vary among hospitals and health care providers. What happens after the procedure?  Your blood pressure, heart rate, breathing rate, and blood oxygen level will be monitored until you leave the hospital or clinic.  You may continue to receive medicine and fluids through an IV.  You may have some soreness or pain in your abdomen and urethra. Medicines will be available to help you.  You will be encouraged to get up and walk around as soon as you can.  You may have a catheter draining your urine.  You will have some blood in your urine.  Do not drive for 24 hours if you were given a sedative during your procedure. Summary  Ureteral stent implantation is a procedure to insert a flexible, soft, plastic tube (stent) into a ureter.  You may have a stent implanted to support the ureter while it heals after a procedure or to open the flow of urine if there is a blockage.  Follow instructions from your health care provider about taking medicines and about eating and drinking before the procedure.  Depending on your condition, you may have a stent for just a few weeks, or you may have a long-term stent that will need to be replaced every  few months. This information is not intended to replace advice given to you by your health care provider. Make sure you discuss any questions you have with your health care provider. Document Revised: 07/29/2018 Document Reviewed: 07/30/2018 Elsevier Patient Education  2021 [employer].

## 2021-03-21 NOTE — Anesthesia Procedure Notes (Signed)
Procedure Name: Intubation Date/Time: 03/21/2021 5:13 PM Performed by: Lowry Bowl, CRNA Pre-anesthesia Checklist: Patient identified, Emergency Drugs available, Suction available and Patient being monitored Patient Re-evaluated:Patient Re-evaluated prior to induction Oxygen Delivery Method: Circle system utilized Preoxygenation: Pre-oxygenation with 100% oxygen Induction Type: IV induction and Cricoid Pressure applied Ventilation: Mask ventilation without difficulty Laryngoscope Size: 3 and McGraph Grade View: Grade I Tube type: Oral Tube size: 7.0 mm Number of attempts: 1 Airway Equipment and Method: Stylet and Video-laryngoscopy Placement Confirmation: ETT inserted through vocal cords under direct vision,  positive ETCO2 and breath sounds checked- equal and bilateral Secured at: 21 cm Tube secured with: Tape Dental Injury: Teeth and Oropharynx as per pre-operative assessment

## 2021-03-21 NOTE — Discharge Instructions (Signed)
AMBULATORY SURGERY  DISCHARGE INSTRUCTIONS   1) The drugs that you were given will stay in your system until tomorrow so for the next 24 hours you should not:  A) Drive an automobile B) Make any legal decisions C) Drink any alcoholic beverage   2) You may resume regular meals tomorrow.  Today it is better to start with liquids and gradually work up to solid foods.  You may eat anything you prefer, but it is better to start with liquids, then soup and crackers, and gradually work up to solid foods.   3) Please notify your doctor immediately if you have any unusual bleeding, trouble breathing, redness and pain at the surgery site, drainage, fever, or pain not relieved by medication.    4) Additional Instructions:   Please contact your physician with any problems or Same Day Surgery at [phone], Monday through Friday 6 am to 4 pm, or [place] at [facility] number at [phone].DISCHARGE INSTRUCTIONS FOR URETERAL STENT   MEDICATIONS:  1. Resume all your other meds from home.  2.  AZO (over-the-counter) can help with the burning/stinging when you urinate. 3.  Oxycodone is for moderate/severe pain, if needed and Rx was sent to your pharmacy. 4.  Rx is for tamsulosin and oxybutynin were sent to pharmacy which will help with stent irritation 5.  An antibiotic was sent to your pharmacy   ACTIVITY:  1. May resume regular activities in 24 hours. 2. No driving while on narcotic pain medications  3. Drink plenty of water  4. Continue to walk at home - you can still get blood clots when you are at home, so keep active, but don't over do it.  5. May return to work/school tomorrow or when you feel ready    SIGNS/SYMPTOMS TO CALL:  Common postoperative symptoms include urinary frequency, urgency, bladder spasm and blood in the urine  Please call us if you have a fever greater than 101.5, uncontrolled nausea/vomiting, uncontrolled pain, dizziness, unable to urinate, excessively  bloody urine, chest pain, shortness of breath, leg swelling, leg pain, or any other concerns or questions.   You can reach [place] at [phone].   FOLLOW-UP:  1. You we will be contacted regarding a follow-up appointment for stone treatment

## 2021-03-21 NOTE — H&P (View-Only) (Signed)
03/21/21 2:51 PM   Lawrence Marseilles [birth year] [id number]  CC: Left flank pain  HPI: I saw [name] as an add-on in urology clinic today for evaluation of left flank pain.  She is a healthy 60 year old female with history of recurrent nephrolithiasis requiring shockwave, ureteroscopy, and PCNL in the past.  She has done well over the last 3 to 4 years.  She reports 2 days of severe left flank pain and nausea.  She denies any fevers or chills at home.  I personally reviewed her KUB today that suggest a 1 cm left proximal ureteral stone.  UA suspicious for infection with 0-5 squamous cells, 20-50 WBCs, greater than 50 RBCs, many bacteria, calcium oxalate crystals present.  Will send for culture.   PMH: Past Medical History:  Diagnosis Date  . Diverticulitis   . History of kidney stones 08/2017   right nephrolithiasis, left renal colic    Surgical History: Past Surgical History:  Procedure Laterality Date  . ABDOMINAL HYSTERECTOMY    . BREAST SURGERY     biopsy  . COLONOSCOPY    . CYSTOSCOPY Left 12/05/2016   stent placement  . EXTRACORPOREAL SHOCK WAVE LITHOTRIPSY Left 09/05/2017   Procedure: EXTRACORPOREAL SHOCK WAVE LITHOTRIPSY (ESWL);  Surgeon: Hollice Espy, MD;  Location: [facility] ORS;  Service: Urology;  Laterality: Left;  . EXTRACORPOREAL SHOCK WAVE LITHOTRIPSY Right 10/17/2017   Procedure: EXTRACORPOREAL SHOCK WAVE LITHOTRIPSY (ESWL);  Surgeon: Abbie Sons, MD;  Location: [facility] ORS;  Service: Urology;  Laterality: Right;  . URETEROLITHOTOMY Left 11/2016    Family History: Family History  Problem Relation Age of Onset  . Heart Problems Father   . Healthy Mother     Social History:  reports that she has never smoked. She has never used smokeless tobacco. She reports that she does not drink alcohol and does not use drugs.  Physical Exam: BP 139/87   Pulse (!) 111   Temp 98.9 F (37.2 C) (Oral)   Ht 5\' 8"  (1.727 m)   Wt 177 lb (80.3 kg)   BMI 26.91  kg/m    Constitutional: Uncomfortable, in pain Cardiovascular: Tachycardic, regular rhythm Respiratory: Clear to auscultation bilaterally GI: Abdomen is soft, nontender, nondistended, no abdominal masses GU: Left CVA tenderness  Laboratory Data: Reviewed, see HPI  Pertinent Imaging: I have personally viewed and interpreted the KUB showing a 1 cm left proximal ureteral stone.  Assessment & Plan:   60 year old female with severe left renal colic likely secondary to a 1 cm left proximal ureteral stone based on KUB today.  Urinalysis today also grossly concerning for infection.  Ceftriaxone and Toradol given in clinic today.  We discussed the need for drainage in the setting of an infected and obstructed system.  A ureteral stent is a small plastic tube that is placed cystoscopically with one end in the kidney and the other end in the bladder that allows the infection from the kidney to drain, and relieves pain from the obstructing stone.  We discussed the risks at length including bleeding, infection, sepsis, death, ureteral injury, and stent related symptoms including urgency/frequency/dysuria/flank pain/gross hematuria.  There is a low, but not 0, risk of inability to pass the ureteral stent alongside the stone from below which would require percutaneous nephrostomy tube by interventional radiology.  Finally, we discussed possible prolonged hospitalization and recovery, possible temporary Foley catheter placement, and 10 to 14-day course of antibiotics.  We reviewed the need for a follow-up procedure for definitive management of  their stone when the infection has been treated in 2 to 3 weeks with either ureteroscopy/laser lithotripsy or shockwave lithotripsy.  I discussed her case with Dr. Brandon who is on-call today, and she was able to add her to the OR schedule for a cystoscopy and left ureteral stent at this afternoon.  She will have a friend drive her to [facility] this afternoon with plan for  left ureteral stent placement and possible admission.  Janeane Cozart, MD 03/21/2021  [facility] [address], [address] [place], [place] [address] [phone]   

## 2021-03-22 ENCOUNTER — Encounter: Payer: Self-pay | Admitting: Urology

## 2021-03-23 LAB — URINE CULTURE
Culture: NO GROWTH
Culture: NO GROWTH

## 2021-03-23 NOTE — Anesthesia Postprocedure Evaluation (Signed)
Anesthesia Post Note  Patient: Brittney Andrews  Procedure(s) Performed: CYSTOSCOPY WITH STENT PLACEMENT (Left Ureter)  Patient location during evaluation: PACU Anesthesia Type: General Level of consciousness: awake and alert and oriented Pain management: pain level controlled Vital Signs Assessment: post-procedure vital signs reviewed and stable Respiratory status: spontaneous breathing Cardiovascular status: blood pressure returned to baseline Anesthetic complications: no   No complications documented.   Last Vitals:  Vitals:   03/21/21 1830 03/21/21 1837  BP: (!) 128/93 (!) 155/98  Pulse:  84  Resp: 16 15  Temp: 36.9 C 36.6 C  SpO2: 100% 99%    Last Pain:  Vitals:   03/22/21 0951  TempSrc:   PainSc: 0-No pain                 Baneen Wieseler

## 2021-03-24 ENCOUNTER — Telehealth: Payer: Self-pay | Admitting: Urology

## 2021-03-24 NOTE — Telephone Encounter (Signed)
Please let patient know her intraoperative urine culture showed no evidence of infection.  Does she want to schedule shockwave lithotripsy or ureteroscopy as her procedure of stone treatment?

## 2021-03-24 NOTE — Telephone Encounter (Signed)
Patient weill call us back to let us know which one she would like to do

## 2021-03-28 NOTE — Interval H&P Note (Signed)
History and Physical Interval Note:  03/28/2021 3:18 PM  Brittney Andrews  has presented today for surgery, with the diagnosis of Left Ureteral Stone.  The various methods of treatment have been discussed with the patient and family. After consideration of risks, benefits and other options for treatment, the patient has consented to  Procedure(s): [place] (Left) as a surgical intervention.  The patient's history has been reviewed, patient examined, no change in status, stable for surgery.  I have reviewed the patient's chart and labs.  Questions were answered to the patient's satisfaction.     [place]

## 2021-03-29 ENCOUNTER — Telehealth: Payer: Self-pay | Admitting: Urology

## 2021-03-29 NOTE — Telephone Encounter (Signed)
Pt. Left a voice mail stating she needs to talk to Dr. Dene Gentry nurse because she has questions about choosing a procedure.

## 2021-03-29 NOTE — Telephone Encounter (Signed)
Patient would like to schedule surgery ureteroscopy

## 2021-04-04 ENCOUNTER — Other Ambulatory Visit: Payer: Self-pay | Admitting: Urology

## 2021-04-04 ENCOUNTER — Other Ambulatory Visit: Payer: Self-pay

## 2021-04-04 ENCOUNTER — Ambulatory Visit (INDEPENDENT_AMBULATORY_CARE_PROVIDER_SITE_OTHER): Payer: BC Managed Care – PPO | Admitting: Family Medicine

## 2021-04-04 DIAGNOSIS — N201 Calculus of ureter: Secondary | ICD-10-CM

## 2021-04-04 DIAGNOSIS — N200 Calculus of kidney: Secondary | ICD-10-CM

## 2021-04-04 MED ORDER — KETOROLAC TROMETHAMINE 60 MG/2ML IM SOLN
60.0000 mg | Freq: Once | INTRAMUSCULAR | Status: AC
  Administered 2021-04-04: 60 mg via INTRAMUSCULAR

## 2021-04-04 MED ORDER — MIRABEGRON ER 25 MG PO TB24: 25.0000 mg | ORAL_TABLET | Freq: Every day | ORAL | 0 refills | Status: DC

## 2021-04-04 MED ORDER — TAMSULOSIN HCL 0.4 MG PO CAPS: 0.4000 mg | ORAL_CAPSULE | Freq: Every day | ORAL | 0 refills | Status: DC

## 2021-04-04 NOTE — Telephone Encounter (Signed)
Patient called stating that she is in terrible pain and discomfort from her stent. She called checking on surgery date status. She is out of her oxybutinin and flomax. She states that they did not help much with her pain. Patient was offered a visit with PA today for further evaluation and pain management visit she states she is not able to afford her copay. It was explained that we could defer this today or possibly offer a nurse visit Toradol injection apt with no copay if this is agreeable with her surgery plan. Possible use of OTC NSAIDS was discussed for pain management. Refill of Tamsulosin and samples of Myrbetriq 25mg  2wks to help with stent pain and discomfort. Ok to proceed with this plan?

## 2021-04-04 NOTE — Addendum Note (Signed)
Addended by: Abbie Sons on: 04/04/2021 10:57 AM   Modules accepted: Orders

## 2021-04-04 NOTE — Telephone Encounter (Signed)
Patient notified and added on to nurse schedule today

## 2021-04-04 NOTE — Addendum Note (Signed)
Addended by: Tommy Rainwater on: 04/04/2021 10:06 AM   Modules accepted: Orders

## 2021-04-04 NOTE — Telephone Encounter (Signed)
Agree with plan.  It looks like I have time on the schedule next Tuesday if she wants to move surgery up.

## 2021-04-05 LAB — MICROSCOPIC EXAMINATION: RBC, Urine: >30 /hpf — AB (ref 0–2)

## 2021-04-05 LAB — URINALYSIS, COMPLETE
Bilirubin, UA: NEGATIVE
Glucose, UA: NEGATIVE
Ketones, UA: NEGATIVE
Nitrite, UA: NEGATIVE
Specific Gravity, UA: 1.025 (ref 1.005–1.030)
Urobilinogen, Ur: 1 mg/dL (ref 0.2–1.0)
pH, UA: 7 (ref 5.0–7.5)

## 2021-04-07 ENCOUNTER — Other Ambulatory Visit: Payer: Self-pay | Admitting: *Deleted

## 2021-04-07 ENCOUNTER — Other Ambulatory Visit: Payer: Self-pay

## 2021-04-07 ENCOUNTER — Other Ambulatory Visit
Admission: RE | Admit: 2021-04-07 | Discharge: 2021-04-07 | Disposition: A | Discharge status: Home / Self Care | Payer: BC Managed Care – PPO | Source: Ambulatory Visit | Attending: Urology | Admitting: Urology

## 2021-04-07 LAB — CULTURE, URINE COMPREHENSIVE

## 2021-04-07 MED ORDER — CIPROFLOXACIN HCL 250 MG PO TABS: 250.0000 mg | ORAL_TABLET | Freq: Two times a day (BID) | ORAL | 0 refills | Status: AC

## 2021-04-07 NOTE — Patient Instructions (Addendum)
INSTRUCTIONS FOR SURGERY     Your surgery is scheduled for:   Tuesday, June 7TH     To find out your arrival time for the day of surgery,          please call [phone] between 1 pm and 3 pm on :  Monday, June 6TH     When you arrive for surgery, report to the [facility].  ONCE THEY HAVE COMPLETED THEIR PROCESS, PROCEED TO THE SECOND FLOOR AND SIGN IN AT THE SURGERY DESK.   REMEMBER: Instructions that are not followed completely may result in serious medical risk,  up to and including death, or upon the discretion of your surgeon and anesthesiologist,            your surgery may need to be rescheduled.  __X__ 1. Do not eat food after midnight the night before your procedure.                    No gum, candy, lozenger, tic tacs, tums or hard candies.                  ABSOLUTELY NOTHING SOLID IN YOUR MOUTH AFTER MIDNIGHT                    You may drink unlimited clear liquids up to 2 hours before you are scheduled to arrive for surgery.                   Do not drink anything within those 2 hours unless you need to take medicine, then take the                   smallest amount you need.  Clear liquids include:  water, apple juice without pulp,                   any flavor Gatorade, Black coffee, black tea.  Sugar may be added but no dairy/ honey /lemon.                        Broth and jello is not considered a clear liquid.  __x__  2. On the morning of surgery, please brush your teeth with toothpaste and water. You may rinse with                  mouthwash if you wish but DO NOT SWALLOW TOOTHPASTE OR MOUTHWASH  __X___3. NO alcohol for 24 hours before or after surgery.  __x___ 4.  Do NOT smoke or use e-cigarettes for 24 HOURS PRIOR TO SURGERY.                      DO NOT Use any chewable tobacco products for at least 6 hours prior to surgery.  __x___ 5. If you start any new  medication after this appointment and prior to surgery, please                   Bring it with you on the day of surgery.  ___x__ 6. Notify your  doctor if there is any change in your medical condition, such as fever,                  infection, vomitting, diarrhea or any open sores.  __x___ 7.  USE ANTIBACTERIAL SOAP as instructed, the night before surgery and the day of surgery.                   Once you have washed with this soap, do NOT use any of the following: Powders, perfumes                    or lotions. Please do not wear make up, hairpins, clips or nail polish. You MAY wear deodorant.                                                    Women need to shave 48 hours prior to surgery.                   DO NOT wear ANY jewelry on the day of surgery. If there are rings that are too tight to                    remove easily, please address this prior to the surgery day. Piercings need to be removed.                                                                     NO METAL ON YOUR BODY.                    Do NOT bring any valuables.  If you came to Pre-Admit testing then you will not need license,                     insurance card or credit card.  If you will be staying overnight, please either leave your things in                     the car or have your family be responsible for these items.                     [facility] IS NOT RESPONSIBLE FOR BELONGINGS OR VALUABLES.  ___X__ 8. DO NOT wear contact lenses on surgery day.  You may not have dentures,                     Hearing aides, contacts or glasses in the operating room. These items can be                    Placed in the Recovery Room to receive immediately after surgery.  __x___ 9. IF YOU ARE SCHEDULED TO GO HOME ON THE SAME DAY, YOU MUST                   Have someone to drive you home and to stay with you  for the first 24 hours.  Have an arrangement prior to arriving on surgery day.  ___x__ 10. Take  the following medications on the morning of surgery with a sip of water:                              1. MYRBETRIQ                     2. FLOMAX                     3. CIPRO                     4.   __X__  12. STOP ALL ASPIRIN PRODUCTS AS OF TODAY, June 3RD                       THIS INCLUDES BC POWDERS / GOODIES POWDER  __x___ 13. STOP Anti-inflammatories as of TODAY, June 3RD                      This includes IBUPROFEN / MOTRIN / ADVIL / ALEVE/ NAPROXYN                    YOU MAY TAKE TYLENOL ANY TIME PRIOR TO SURGERY.  ____X_ 14.  Stop supplements until after surgery.                       ___X___18. Wear clean and comfortable clothing to the hospital.  PLEASE BRING PHONE NUMBERS FOR YOUR CONTACTS MAKE PLANS TO [facility]!!

## 2021-04-10 MED ORDER — FAMOTIDINE 20 MG PO TABS: 20.0000 mg | ORAL_TABLET | Freq: Once | ORAL | Status: AC

## 2021-04-10 MED ORDER — CIPROFLOXACIN IN D5W 400 MG/200ML IV SOLN
400.0000 mg | Freq: Once | INTRAVENOUS | Status: AC
  Administered 2021-04-11: 400 mg via INTRAVENOUS

## 2021-04-10 MED ORDER — CHLORHEXIDINE GLUCONATE 0.12 % MT SOLN: 15.0000 mL | Freq: Once | OROMUCOSAL | Status: AC

## 2021-04-10 MED ORDER — LACTATED RINGERS IV SOLN: INTRAVENOUS | Status: DC

## 2021-04-10 MED ORDER — ORAL CARE MOUTH RINSE: 15.0000 mL | Freq: Once | OROMUCOSAL | Status: AC

## 2021-04-11 ENCOUNTER — Ambulatory Visit: Payer: BC Managed Care – PPO | Admitting: Anesthesiology

## 2021-04-11 ENCOUNTER — Encounter: Payer: Self-pay | Admitting: Urology

## 2021-04-11 ENCOUNTER — Other Ambulatory Visit: Payer: Self-pay

## 2021-04-11 ENCOUNTER — Encounter
Admission: RE | Disposition: A | Discharge status: Home / Self Care | Payer: Self-pay | Source: Home / Self Care | Attending: Urology

## 2021-04-11 ENCOUNTER — Ambulatory Visit: Payer: BC Managed Care – PPO

## 2021-04-11 ENCOUNTER — Ambulatory Visit
Admission: RE | Admit: 2021-04-11 | Discharge: 2021-04-11 | Disposition: A | Discharge status: Home / Self Care | Payer: BC Managed Care – PPO | Attending: Urology | Admitting: Urology

## 2021-04-11 DIAGNOSIS — Z8719 Personal history of other diseases of the digestive system: Secondary | ICD-10-CM | POA: Insufficient documentation

## 2021-04-11 DIAGNOSIS — N201 Calculus of ureter: Secondary | ICD-10-CM | POA: Insufficient documentation

## 2021-04-11 DIAGNOSIS — Z90710 Acquired absence of both cervix and uterus: Secondary | ICD-10-CM | POA: Diagnosis not present

## 2021-04-11 HISTORY — PX: CYSTOSCOPY/URETEROSCOPY/HOLMIUM LASER/STENT PLACEMENT: SHX6546

## 2021-04-11 SURGERY — CYSTOSCOPY/URETEROSCOPY/HOLMIUM LASER/STENT PLACEMENT
Anesthesia: General | Laterality: Left

## 2021-04-11 MED ORDER — LACTATED RINGERS IV SOLN: INTRAVENOUS | Status: DC | PRN

## 2021-04-11 MED ORDER — ONDANSETRON HCL 4 MG/2ML IJ SOLN
4.0000 mg | Freq: Once | INTRAMUSCULAR | Status: AC | PRN
  Administered 2021-04-11: 4 mg via INTRAVENOUS

## 2021-04-11 MED ORDER — MIDAZOLAM HCL 2 MG/2ML IJ SOLN
INTRAMUSCULAR | Status: DC | PRN
  Administered 2021-04-11: 2 mg via INTRAVENOUS

## 2021-04-11 MED ORDER — CIPROFLOXACIN IN D5W 400 MG/200ML IV SOLN
INTRAVENOUS | Status: AC
  Filled 2021-04-11: qty 200

## 2021-04-11 MED ORDER — FENTANYL CITRATE (PF) 100 MCG/2ML IJ SOLN
INTRAMUSCULAR | Status: AC
  Filled 2021-04-11: qty 2

## 2021-04-11 MED ORDER — KETOROLAC TROMETHAMINE 30 MG/ML IJ SOLN
INTRAMUSCULAR | Status: DC | PRN
  Administered 2021-04-11: 30 mg via INTRAVENOUS

## 2021-04-11 MED ORDER — MIDAZOLAM HCL 2 MG/2ML IJ SOLN
INTRAMUSCULAR | Status: AC
  Filled 2021-04-11: qty 2

## 2021-04-11 MED ORDER — IOHEXOL 180 MG/ML  SOLN
INTRAMUSCULAR | Status: DC | PRN
  Administered 2021-04-11 (×2): 5 mL

## 2021-04-11 MED ORDER — PROPOFOL 10 MG/ML IV BOLUS
INTRAVENOUS | Status: AC
  Filled 2021-04-11: qty 20

## 2021-04-11 MED ORDER — FENTANYL CITRATE (PF) 100 MCG/2ML IJ SOLN
INTRAMUSCULAR | Status: AC
  Administered 2021-04-11: 25 ug via INTRAVENOUS
  Filled 2021-04-11: qty 2

## 2021-04-11 MED ORDER — DEXAMETHASONE SODIUM PHOSPHATE 10 MG/ML IJ SOLN
INTRAMUSCULAR | Status: DC | PRN
  Administered 2021-04-11: 10 mg via INTRAVENOUS

## 2021-04-11 MED ORDER — ONDANSETRON HCL 4 MG/2ML IJ SOLN
INTRAMUSCULAR | Status: DC | PRN
  Administered 2021-04-11: 4 mg via INTRAVENOUS

## 2021-04-11 MED ORDER — CHLORHEXIDINE GLUCONATE 0.12 % MT SOLN
OROMUCOSAL | Status: AC
  Administered 2021-04-11: 15 mL via OROMUCOSAL
  Filled 2021-04-11: qty 15

## 2021-04-11 MED ORDER — FENTANYL CITRATE (PF) 100 MCG/2ML IJ SOLN
INTRAMUSCULAR | Status: DC | PRN
  Administered 2021-04-11: 50 ug via INTRAVENOUS

## 2021-04-11 MED ORDER — LIDOCAINE HCL (CARDIAC) PF 100 MG/5ML IV SOSY
PREFILLED_SYRINGE | INTRAVENOUS | Status: DC | PRN
  Administered 2021-04-11: 80 mg via INTRAVENOUS

## 2021-04-11 MED ORDER — PROPOFOL 10 MG/ML IV BOLUS
INTRAVENOUS | Status: DC | PRN
  Administered 2021-04-11: 150 mg via INTRAVENOUS

## 2021-04-11 MED ORDER — ONDANSETRON HCL 4 MG/2ML IJ SOLN
INTRAMUSCULAR | Status: AC
  Filled 2021-04-11: qty 2

## 2021-04-11 MED ORDER — FENTANYL CITRATE (PF) 100 MCG/2ML IJ SOLN
25.0000 ug | INTRAMUSCULAR | Status: DC | PRN
  Administered 2021-04-11: 25 ug via INTRAVENOUS

## 2021-04-11 MED ORDER — ONDANSETRON HCL 4 MG PO TABS: 4.0000 mg | ORAL_TABLET | Freq: Three times a day (TID) | ORAL | 0 refills | Status: DC | PRN

## 2021-04-11 MED ORDER — FAMOTIDINE 20 MG PO TABS
ORAL_TABLET | ORAL | Status: AC
  Administered 2021-04-11: 20 mg via ORAL
  Filled 2021-04-11: qty 1

## 2021-04-11 SURGICAL SUPPLY — 29 items
BAG DRAIN CYSTO-URO LG1000N (MISCELLANEOUS) ×3 IMPLANT
BASKET ZERO TIP 1.9FR (BASKET) ×2 IMPLANT
BRUSH SCRUB EZ 1% IODOPHOR (MISCELLANEOUS) ×3 IMPLANT
BSKT STON RTRVL ZERO TP 1.9FR (BASKET) ×1
CATH URET FLEX-TIP 2 LUMEN 10F (CATHETERS) IMPLANT
CATH URETL 5X70 OPEN END (CATHETERS) IMPLANT
CNTNR SPEC 2.5X3XGRAD LEK (MISCELLANEOUS) ×1
CONT SPEC 4OZ STER OR WHT (MISCELLANEOUS) ×2
CONT SPEC 4OZ STRL OR WHT (MISCELLANEOUS) ×1
CONTAINER SPEC 2.5X3XGRAD LEK (MISCELLANEOUS) IMPLANT
DRAPE UTILITY 15X26 TOWEL STRL (DRAPES) ×3 IMPLANT
GLOVE SURG UNDER POLY LF SZ7.5 (GLOVE) ×3 IMPLANT
GOWN STRL REUS W/ TWL LRG LVL3 (GOWN DISPOSABLE) ×1 IMPLANT
GOWN STRL REUS W/ TWL XL LVL3 (GOWN DISPOSABLE) ×1 IMPLANT
GOWN STRL REUS W/TWL LRG LVL3 (GOWN DISPOSABLE) ×3
GOWN STRL REUS W/TWL XL LVL3 (GOWN DISPOSABLE) ×3
GUIDEWIRE STR DUAL SENSOR (WIRE) ×3 IMPLANT
INFUSOR MANOMETER BAG 3000ML (MISCELLANEOUS) ×3 IMPLANT
IV NS IRRIG 3000ML ARTHROMATIC (IV SOLUTION) ×3 IMPLANT
KIT TURNOVER CYSTO (KITS) ×3 IMPLANT
PACK CYSTO AR (MISCELLANEOUS) ×3 IMPLANT
SET CYSTO W/LG BORE CLAMP LF (SET/KITS/TRAYS/PACK) ×3 IMPLANT
SHEATH URETERAL 12FRX35CM (MISCELLANEOUS) IMPLANT
STENT URET 6FRX24 CONTOUR (STENTS) IMPLANT
STENT URET 6FRX26 CONTOUR (STENTS) IMPLANT
SURGILUBE 2OZ TUBE FLIPTOP (MISCELLANEOUS) ×3 IMPLANT
TRACTIP FLEXIVA PULSE ID 200 (Laser) ×3 IMPLANT
VALVE UROSEAL ADJ ENDO (VALVE) ×2 IMPLANT
WATER STERILE IRR 1000ML POUR (IV SOLUTION) ×3 IMPLANT

## 2021-04-11 NOTE — Op Note (Signed)
Preoperative diagnosis:  1.  Left proximal ureteral calculus  Postoperative diagnosis:  1.  Left proximal ureteral calculus  Procedure:  1. Cystoscopy 2. Left ureteroscopy and stone removal 3. Ureteroscopic laser lithotripsy 4. Left ureteral stent exchange (6FR/24 cm) 5. Left retrograde pyelography with interpretation  Surgeon: Nicki Reaper C. Conleigh Heinlein, M.D.  Anesthesia: General  Complications: None  Intraoperative findings:  1. Cystoscopy-with exception of inflammatory changes left hemitrigone secondary to the indwelling stent, the bladder mucosa showed no erythema, solid or papillary lesions. 2. Left ureteropyeloscopy-no ureteral stricture or mucosal lesions.  Calculus identified in the proximal ureter.  Pyeloscopy showed no calculi or mucosal abnormalities.  Some stone dust in the renal pelvis. 3. Left retrograde pyelography post procedure showed no filling defects, stone fragments or contrast extravasation   EBL: Minimal  Specimens: 1. Calculus fragments for analysis   Indication: Brittney Andrews is a 60 y.o. female status post left ureteral stent placement 3/87/5643 for renal colic and possible infection.  Intraoperative urine culture was negative.  She presents today for definitive stone treatment.  After reviewing the management options for treatment, the patient elected to proceed with the above surgical procedure(s). We have discussed the potential benefits and risks of the procedure, side effects of the proposed treatment, the likelihood of the patient achieving the goals of the procedure, and any potential problems that might occur during the procedure or recuperation. Informed consent has been obtained.  Description of procedure:  The patient was taken to the operating room and general anesthesia was induced.  The patient was placed in the dorsal lithotomy position, prepped and draped in the usual sterile fashion, and preoperative antibiotics were administered. A  preoperative time-out was performed.   A 21 French cystoscope sheath with obturator was lubricated and passed per urethra.  Panendoscopy was then performed after placing the 30 degree lens with findings as described above.  The left ureteral stent was grasped with endoscopic forceps and brought out to the urethral meatus.  A 0.038 Sensor wire was then placed through the stent into the renal pelvis under fluoroscopic guidance.  The stent was removed and a 4.5 French semirigid ureteroscope was passed per urethra.  The left UO was easily engaged with ureteroscope which was advanced proximally with findings as described above.  The stone was identified and the upper proximal ureter and it was elected to treat with the flexible ureteroscope.  The semirigid ureteroscope was removed and a single channel digital flex scope was advanced alongside the guidewire and easily passed to the proximal ureter.  The calculus was dusted with a 242 m holmium laser fiber at a setting of  2 J / 20 Hz.  Once down to a size less than size 5 mm the calculus was placed in a 1.9 [place] nitinol basket, removed and sent for analysis.  The ureteroscope was repassed and a few ureteral fragments estimated at 2 mm were removed with a stone basket.  The ureteroscope was then advanced into the renal pelvis.  Retrograde pyelogram was performed with findings as described above.  All calyces were examined under fluoroscopic guidance and no calculi or stone fragments were identified with the exception of dust in the renal pelvis.  A 6 FR/24 CM Contour ureteral stent was placed under fluoroscopic guidance.  The wire was then removed with an adequate stent curl noted in the renal pelvis as well as in the bladder.  The bladder was then emptied and the procedure ended.  The patient appeared to tolerate the  procedure well and without complications.  After anesthetic reversal the patient was transported to the PACU in stable condition.  Plan:  The  stent was left attached to a tether and was instructed to remove on Thursday, 04/13/2021  Postop follow-up ~ 1 month   [name], MD

## 2021-04-11 NOTE — Interval H&P Note (Signed)
History and Physical Interval Note:  Status post placement left ureteral stent 03/21/2021 for a 9 mm left proximal ureteral calculus with renal colic and possible infection.  Intraoperative urine culture was negative.  She is having moderate stent irritative voiding symptoms and presents today for definitive stone treatment.  All questions were answered and she desires to proceed  04/11/2021 12:08 PM  Brittney Andrews  has presented today for surgery, with the diagnosis of left ureteral calculus.  The various methods of treatment have been discussed with the patient and family. After consideration of risks, benefits and other options for treatment, the patient has consented to  Procedure(s): CYSTOSCOPY/URETEROSCOPY/HOLMIUM LASER/STENT EXCHANGE (Left) as a surgical intervention.  The patient's history has been reviewed, patient examined, no change in status, stable for surgery.  I have reviewed the patient's chart and labs.  Questions were answered to the patient's satisfaction.     [place]

## 2021-04-11 NOTE — Anesthesia Preprocedure Evaluation (Signed)
Anesthesia Evaluation  Patient identified by MRN, date of birth, ID band Patient awake    Reviewed: Allergy & Precautions, H&P , NPO status , Patient's Chart, lab work & pertinent test results, reviewed documented beta blocker date and time   Airway Mallampati: II  TM Distance: >3 FB Neck ROM: full    Dental  (+) Teeth Intact   Pulmonary neg pulmonary ROS,    Pulmonary exam normal        Cardiovascular Exercise Tolerance: Good negative cardio ROS Normal cardiovascular exam Rate:Normal     Neuro/Psych negative neurological ROS  negative psych ROS   GI/Hepatic negative GI ROS, Neg liver ROS,   Endo/Other  negative endocrine ROS  Renal/GU negative Renal ROS  negative genitourinary   Musculoskeletal   Abdominal   Peds  Hematology negative hematology ROS (+)   Anesthesia Other Findings   Reproductive/Obstetrics negative OB ROS                             Anesthesia Physical Anesthesia Plan  ASA: II  Anesthesia Plan: General LMA   Post-op Pain Management:    Induction:   PONV Risk Score and Plan:   Airway Management Planned:   Additional Equipment:   Intra-op Plan:   Post-operative Plan:   Informed Consent: I have reviewed the patients History and Physical, chart, labs and discussed the procedure including the risks, benefits and alternatives for the proposed anesthesia with the patient or authorized representative who has indicated his/her understanding and acceptance.       Plan Discussed with: CRNA  Anesthesia Plan Comments:         Anesthesia Quick Evaluation

## 2021-04-11 NOTE — Anesthesia Procedure Notes (Signed)
Procedure Name: Intubation Date/Time: 04/11/2021 1:40 PM Performed by: Philbert Riser, CRNA Pre-anesthesia Checklist: Patient identified, Emergency Drugs available, Suction available and Patient being monitored Patient Re-evaluated:Patient Re-evaluated prior to induction Oxygen Delivery Method: Circle system utilized Preoxygenation: Pre-oxygenation with 100% oxygen Induction Type: IV induction Ventilation: Mask ventilation without difficulty LMA: LMA inserted LMA Size: 3.5 Tube type: Oral Number of attempts: 1 Tube secured with: Tape Dental Injury: Teeth and Oropharynx as per pre-operative assessment

## 2021-04-11 NOTE — Discharge Instructions (Signed)
DISCHARGE INSTRUCTIONS FOR KIDNEY STONE/URETERAL STENT   MEDICATIONS:  1. Resume all your other meds from home.  2.  AZO (over-the-counter) can help with the burning/stinging when you urinate. 3.  Continue tamsulosin, Myrbetriq for stent irritation  ACTIVITY:  1. May resume regular activities in 24 hours. 2. No driving while on narcotic pain medications  3. Drink plenty of water  4. Continue to walk at home - you can still get blood clots when you are at home, so keep active, but don't over do it.  5. May return to work/school tomorrow or when you feel ready   BATHING:  1. You can shower. 2. You have a string coming from your urethra: The stent string is attached to your ureteral stent. Do not pull on this.   SIGNS/SYMPTOMS TO CALL:  Common postoperative symptoms include urinary frequency, urgency, bladder spasm and blood in the urine  Please call us if you have a fever greater than 101.5, uncontrolled nausea/vomiting, uncontrolled pain, dizziness, unable to urinate, excessively bloody urine, chest pain, shortness of breath, leg swelling, leg pain, or any other concerns or questions.   You can reach [place] at [phone].   FOLLOW-UP:  1. You we will be contacted regarding postop follow-up ~ 1 month 2. You have a string attached to your stent, you may remove it on Thursday, 04/13/2021. To do this, pull the string until the stent is completely removed. You may feel an odd sensation in your back.  If you have any questions or concerns considering stent removal please contact our office AMBULATORY SURGERY  DISCHARGE INSTRUCTIONS   1) The drugs that you were given will stay in your system until tomorrow so for the next 24 hours you should not:  A) Drive an automobile B) Make any legal decisions C) Drink any alcoholic beverage   2) You may resume regular meals tomorrow.  Today it is better to start with liquids and gradually work up to solid foods.  You may eat anything you prefer, but  it is better to start with liquids, then soup and crackers, and gradually work up to solid foods.   3) Please notify your doctor immediately if you have any unusual bleeding, trouble breathing, redness and pain at the surgery site, drainage, fever, or pain not relieved by medication.  Please contact your physician with any problems or Same Day Surgery at [phone], Monday through Friday 6 am to 4 pm, or [place] at [facility] number at [phone].

## 2021-04-11 NOTE — Transfer of Care (Signed)
Immediate Anesthesia Transfer of Care Note  Patient: Brittney Andrews  Procedure(s) Performed: CYSTOSCOPY/URETEROSCOPY/HOLMIUM LASER/STENT EXCHANGE (Left )  Patient Location: PACU  Anesthesia Type:General  Level of Consciousness: awake  Airway & Oxygen Therapy: Patient Spontanous Breathing and Patient connected to face mask oxygen  Post-op Assessment: Report given to RN and Post -op Vital signs reviewed and stable  Post vital signs: Reviewed and stable  Last Vitals:  Vitals Value Taken Time  BP 131/91 04/11/21 1433  Temp    Pulse 75 04/11/21 1434  Resp 15 04/11/21 1434  SpO2 100 % 04/11/21 1434  Vitals shown include unvalidated device data.  Last Pain:  Vitals:   04/11/21 0948  TempSrc: Temporal  PainSc: 0-No pain      Patients Stated Pain Goal: 0 (35/45/62 5638)  Complications: No complications documented.

## 2021-04-12 ENCOUNTER — Encounter: Payer: Self-pay | Admitting: Urology

## 2021-04-12 NOTE — Anesthesia Postprocedure Evaluation (Signed)
Anesthesia Post Note  Patient: Brittney Andrews  Procedure(s) Performed: CYSTOSCOPY/URETEROSCOPY/HOLMIUM LASER/STENT EXCHANGE (Left )  Patient location during evaluation: PACU Anesthesia Type: General Level of consciousness: awake and alert Pain management: pain level controlled Vital Signs Assessment: post-procedure vital signs reviewed and stable Respiratory status: spontaneous breathing, nonlabored ventilation, respiratory function stable and patient connected to nasal cannula oxygen Cardiovascular status: blood pressure returned to baseline and stable Postop Assessment: no apparent nausea or vomiting Anesthetic complications: no   No complications documented.   Last Vitals:  Vitals:   04/11/21 1515 04/11/21 1541  BP: 128/71 112/64  Pulse: (!) 54 60  Resp: 17 17  Temp: 36.6 C 36.6 C  SpO2: 96% 99%    Last Pain:  Vitals:   04/12/21 0855  TempSrc:   PainSc: 0-No pain                 Molli Barrows

## 2021-04-15 LAB — CALCULI, WITH PHOTOGRAPH (CLINICAL LAB)
Calcium Oxalate Dihydrate: 80 %
Calcium Oxalate Monohydrate: 10 %
Hydroxyapatite: 10 %
Weight Calculi: 11 mg

## 2021-05-15 ENCOUNTER — Encounter: Payer: BC Managed Care – PPO | Admitting: Urology

## 2021-05-24 ENCOUNTER — Other Ambulatory Visit: Payer: Self-pay

## 2021-05-24 ENCOUNTER — Ambulatory Visit (INDEPENDENT_AMBULATORY_CARE_PROVIDER_SITE_OTHER): Payer: BC Managed Care – PPO | Admitting: Urology

## 2021-05-24 ENCOUNTER — Encounter: Payer: Self-pay | Admitting: Urology

## 2021-05-24 VITALS — BP 115/72 | HR 84 | Ht 68.0 in | Wt 160.0 lb

## 2021-05-24 DIAGNOSIS — N200 Calculus of kidney: Secondary | ICD-10-CM

## 2021-05-24 NOTE — Patient Instructions (Signed)

## 2021-05-24 NOTE — Progress Notes (Signed)
05/24/2021 9:38 AM   Brittney Andrews September 19, 1961 [id number]  Referring provider: Sofie Hartigan, [place],  [place] [address]  Chief Complaint  Patient presents with   Nephrolithiasis    HPI: 60 y.o. female presents for postop follow-up.  Status post ureteroscopic removal of a 10 mm left proximal ureteral calculus 04/11/2021 Stent removed 2 days postop without problems No complaints since stent removal Prior KUB did show right renal calculi; she may be interested in pursuing treatment Several years since prior metabolic evaluation Stone analysis CaOxMono/CaOxDi/hydroxyapatite 10/80/10   PMH: Past Medical History:  Diagnosis Date   Diverticulitis    History of kidney stones 08/2017   right nephrolithiasis, left renal colic    Surgical History: Past Surgical History:  Procedure Laterality Date   ABDOMINAL HYSTERECTOMY     BREAST SURGERY     biopsy   COLONOSCOPY     CYSTOSCOPY Left 12/05/2016   stent placement   CYSTOSCOPY WITH STENT PLACEMENT Left 03/21/2021   Procedure: CYSTOSCOPY WITH STENT PLACEMENT;  Surgeon: Abbie Sons, MD;  Location: [facility];  Service: Urology;  Laterality: Left;   CYSTOSCOPY/URETEROSCOPY/HOLMIUM LASER/STENT PLACEMENT Left 04/11/2021   Procedure: CYSTOSCOPY/URETEROSCOPY/HOLMIUM LASER/STENT EXCHANGE;  Surgeon: Abbie Sons, MD;  Location: [facility];  Service: Urology;  Laterality: Left;   EXTRACORPOREAL SHOCK WAVE LITHOTRIPSY Left 09/05/2017   Procedure: EXTRACORPOREAL SHOCK WAVE LITHOTRIPSY (ESWL);  Surgeon: Hollice Espy, MD;  Location: [facility];  Service: Urology;  Laterality: Left;   EXTRACORPOREAL SHOCK WAVE LITHOTRIPSY Right 10/17/2017   Procedure: EXTRACORPOREAL SHOCK WAVE LITHOTRIPSY (ESWL);  Surgeon: Abbie Sons, MD;  Location: [facility];  Service: Urology;  Laterality: Right;   EYE SURGERY Bilateral    cats   URETEROLITHOTOMY Left 11/2016    Home Medications:  Allergies as of 05/24/2021   No Known  Allergies      Medication List        Accurate as of May 24, 2021  9:38 AM. If you have any questions, ask your nurse or doctor.          STOP taking these medications    mirabegron ER 25 MG Tb24 tablet Commonly known as: MYRBETRIQ Stopped by: Abbie Sons, MD   ondansetron 4 MG tablet Commonly known as: Zofran Stopped by: Abbie Sons, MD   tamsulosin 0.4 MG Caps capsule Commonly known as: FLOMAX Stopped by: Abbie Sons, MD        Allergies: No Known Allergies  Family History: Family History  Problem Relation Age of Onset   Heart Problems Father    Healthy Mother     Social History:  reports that she has never smoked. She has never used smokeless tobacco. She reports that she does not drink alcohol and does not use drugs.   Physical Exam: BP 115/72   Pulse 84   Ht 5\' 8"  (1.727 m)   Wt 160 lb (72.6 kg)   BMI 24.33 kg/m   Constitutional:  Alert and oriented, No acute distress. HEENT: [place] AT, moist mucus membranes.  Trachea midline, no masses. Cardiovascular: No clubbing, cyanosis, or edema. Respiratory: Normal respiratory effort, no increased work of breathing. GI: Abdomen is soft, nontender, nondistended, no abdominal masses Psychiatric: Normal mood and affect.   Assessment & Plan:    1.  Nephrolithiasis Doing well status post left ureteroscopic stone removal Nonobstructing right renal calculi Recommended repeat metabolic evaluation to include 24-hour urine study and blood work Follow-up KUB August 2022 We discussed  for treatment of her right renal calculi.  Unlikely all calculi can be treated with 1 ESWL session though I will could be treated with ureteroscopy She will think over these options and will discuss further once her KUB is done                                    Abbie Sons, MD  [facility] [address], [place], [place] [address] [phone]

## 2021-06-09 ENCOUNTER — Ambulatory Visit
Admission: RE | Admit: 2021-06-09 | Discharge: 2021-06-09 | Disposition: A | Discharge status: Home / Self Care | Payer: BC Managed Care – PPO | Attending: Urology | Admitting: Urology

## 2021-06-09 ENCOUNTER — Other Ambulatory Visit: Payer: BC Managed Care – PPO

## 2021-06-09 ENCOUNTER — Ambulatory Visit
Admission: RE | Admit: 2021-06-09 | Discharge: 2021-06-09 | Disposition: A | Discharge status: Home / Self Care | Payer: BC Managed Care – PPO | Source: Ambulatory Visit | Attending: Urology | Admitting: Urology

## 2021-06-09 ENCOUNTER — Other Ambulatory Visit: Payer: Self-pay

## 2021-06-09 DIAGNOSIS — N200 Calculus of kidney: Secondary | ICD-10-CM | POA: Diagnosis present

## 2021-06-13 ENCOUNTER — Other Ambulatory Visit: Payer: Self-pay | Admitting: Urology

## 2021-06-13 ENCOUNTER — Telehealth: Payer: Self-pay | Admitting: Family Medicine

## 2021-06-13 DIAGNOSIS — N200 Calculus of kidney: Secondary | ICD-10-CM

## 2021-06-13 NOTE — Telephone Encounter (Signed)
-----   Message from Abbie Sons, MD sent at 06/13/2021 11:16 AM EDT ----- Difficult to tell on KUB if calcification seen over the kidney outline or in or out of the kidney.  Recommend noncontrast CT abdomen/pelvis for further evaluation if she is still interested in getting any right sided stones treated.  Order was entered

## 2021-06-13 NOTE — Telephone Encounter (Signed)
LMOM for patient to return call.

## 2021-06-13 NOTE — Telephone Encounter (Signed)
Patient notified and voiced understanding. She is waiting on imaging to call for Ct appointment.

## 2021-06-13 NOTE — Progress Notes (Signed)
CT ordered. 

## 2021-06-21 DIAGNOSIS — C44319 Basal cell carcinoma of skin of other parts of face: Secondary | ICD-10-CM | POA: Insufficient documentation

## 2021-06-21 DIAGNOSIS — M950 Acquired deformity of nose: Secondary | ICD-10-CM | POA: Insufficient documentation

## 2021-06-21 DIAGNOSIS — K130 Diseases of lips: Secondary | ICD-10-CM | POA: Insufficient documentation

## 2021-06-22 ENCOUNTER — Other Ambulatory Visit: Payer: Self-pay | Admitting: Urology

## 2021-06-22 ENCOUNTER — Telehealth: Payer: Self-pay | Admitting: *Deleted

## 2021-06-22 ENCOUNTER — Ambulatory Visit
Admission: RE | Admit: 2021-06-22 | Discharge: 2021-06-22 | Disposition: A | Discharge status: Home / Self Care | Payer: BC Managed Care – PPO | Source: Ambulatory Visit | Attending: Urology | Admitting: Urology

## 2021-06-22 ENCOUNTER — Other Ambulatory Visit: Payer: Self-pay

## 2021-06-22 DIAGNOSIS — N200 Calculus of kidney: Secondary | ICD-10-CM | POA: Diagnosis present

## 2021-06-22 NOTE — Telephone Encounter (Addendum)
-----   Message from Abbie Sons, MD sent at 06/22/2021  4:16 PM EDT ----- CT reviewed and only has a sand-like stone in the right kidney which is too small to be removed and would not recommend treatment.    Her metabolic evaluation was reviewed.  The most significant abnormality is extremely low urine volume.  Her urine output was only 690 mL and for stone formers it is recommended to drink enough water to keep the urine output >2.5 L/day.  To accomplish this output it would take 3-3.5 L/day on average  Urinary citrate level was slightly low at 335 and would recommend LithoLyte 1 stick dissolved in a glass of water twice daily.  This can also be accomplished by drinking citrus beverages.  The most effective is lemonade with real lemon juice.  She had a telemedicine visit 9/7 for the 24-hour urine results and can cancel.

## 2021-06-23 NOTE — Telephone Encounter (Signed)
Notified patient as instructed, patient pleased °

## 2021-07-04 ENCOUNTER — Ambulatory Visit: Payer: BC Managed Care – PPO

## 2021-07-12 ENCOUNTER — Telehealth: Payer: BC Managed Care – PPO | Admitting: Urology

## 2021-09-01 ENCOUNTER — Other Ambulatory Visit: Payer: Self-pay

## 2021-09-01 DIAGNOSIS — J3489 Other specified disorders of nose and nasal sinuses: Secondary | ICD-10-CM | POA: Insufficient documentation

## 2021-09-01 DIAGNOSIS — C44311 Basal cell carcinoma of skin of nose: Secondary | ICD-10-CM | POA: Insufficient documentation

## 2021-09-01 DIAGNOSIS — C4401 Basal cell carcinoma of skin of lip: Secondary | ICD-10-CM | POA: Insufficient documentation

## 2021-09-01 MED ORDER — CLENPIQ 10-3.5-12 MG-GM -GM/160ML PO SOLN: 320.0000 mL | ORAL | 0 refills | Status: DC

## 2021-10-10 ENCOUNTER — Ambulatory Visit
Admission: EM | Admit: 2021-10-10 | Discharge: 2021-10-10 | Disposition: A | Discharge status: Home / Self Care | Payer: BC Managed Care – PPO | Attending: Physician Assistant | Admitting: Physician Assistant

## 2021-10-10 ENCOUNTER — Other Ambulatory Visit: Payer: Self-pay

## 2021-10-10 ENCOUNTER — Encounter: Payer: Self-pay | Admitting: Emergency Medicine

## 2021-10-10 DIAGNOSIS — R051 Acute cough: Secondary | ICD-10-CM | POA: Diagnosis not present

## 2021-10-10 DIAGNOSIS — R509 Fever, unspecified: Secondary | ICD-10-CM | POA: Insufficient documentation

## 2021-10-10 DIAGNOSIS — R0981 Nasal congestion: Secondary | ICD-10-CM | POA: Diagnosis not present

## 2021-10-10 DIAGNOSIS — B349 Viral infection, unspecified: Secondary | ICD-10-CM | POA: Insufficient documentation

## 2021-10-10 DIAGNOSIS — Z20822 Contact with and (suspected) exposure to covid-19: Secondary | ICD-10-CM | POA: Insufficient documentation

## 2021-10-10 LAB — RESP PANEL BY RT-PCR (FLU A&B, COVID) ARPGX2
Influenza A by PCR: NEGATIVE
Influenza B by PCR: NEGATIVE
SARS Coronavirus 2 by RT PCR: NEGATIVE

## 2021-10-10 MED ORDER — PSEUDOEPH-BROMPHEN-DM 30-2-10 MG/5ML PO SYRP: 10.0000 mL | ORAL_SOLUTION | Freq: Four times a day (QID) | ORAL | 0 refills | Status: DC | PRN

## 2021-10-10 MED ORDER — PROMETHAZINE-DM 6.25-15 MG/5ML PO SYRP: 5.0000 mL | ORAL_SOLUTION | Freq: Four times a day (QID) | ORAL | 0 refills | Status: DC | PRN

## 2021-10-10 NOTE — ED Provider Notes (Signed)
[facility]    CSN: [id number] Arrival date & time: 10/10/21  1757      History   Chief Complaint Chief Complaint  Patient presents with   Nasal Congestion   Cough   Fever    HPI Brittney Andrews is a 60 y.o. female presenting for approximately 2-day history of feeling feverish, fatigue, body aches, cough, nasal congestion and left-sided rib pain when she coughs.  Cough is mostly dry but occasionally productive.  Denies sore throat, chest pain or breathing difficulty.  No vomiting or diarrhea.  No sick contacts or known exposure to COVID-19 or influenza.  Has not taken any over-the-counter medication for symptoms.  Patient otherwise healthy but does have history of basal cell carcinoma and has a defect of her nose that is presently being handled by specialist.  HPI  Past Medical History:  Diagnosis Date   Diverticulitis    History of kidney stones 08/2017   right nephrolithiasis, left renal colic    Patient Active Problem List   Diagnosis Date Noted   Basal cell carcinoma (BCC) of left ala nasi 09/01/2021   Basal cell carcinoma (BCC) of skin of left upper lip 09/01/2021   Nasal obstruction 09/01/2021   Basal cell carcinoma (BCC) of left cheek 06/21/2021   Mohs defect of skin of left upper lip 06/21/2021   Mohs defect of left nose 06/21/2021   Calculus of kidney 01/24/2017    Past Surgical History:  Procedure Laterality Date   ABDOMINAL HYSTERECTOMY     BREAST SURGERY     biopsy   COLONOSCOPY     CYSTOSCOPY Left 12/05/2016   stent placement   CYSTOSCOPY WITH STENT PLACEMENT Left 03/21/2021   Procedure: CYSTOSCOPY WITH STENT PLACEMENT;  Surgeon: Abbie Sons, MD;  Location: [facility];  Service: Urology;  Laterality: Left;   CYSTOSCOPY/URETEROSCOPY/HOLMIUM LASER/STENT PLACEMENT Left 04/11/2021   Procedure: CYSTOSCOPY/URETEROSCOPY/HOLMIUM LASER/STENT EXCHANGE;  Surgeon: Abbie Sons, MD;  Location: [facility];  Service: Urology;  Laterality: Left;    EXTRACORPOREAL SHOCK WAVE LITHOTRIPSY Left 09/05/2017   Procedure: EXTRACORPOREAL SHOCK WAVE LITHOTRIPSY (ESWL);  Surgeon: Hollice Espy, MD;  Location: [facility];  Service: Urology;  Laterality: Left;   EXTRACORPOREAL SHOCK WAVE LITHOTRIPSY Right 10/17/2017   Procedure: EXTRACORPOREAL SHOCK WAVE LITHOTRIPSY (ESWL);  Surgeon: Abbie Sons, MD;  Location: [facility];  Service: Urology;  Laterality: Right;   EYE SURGERY Bilateral    cats   URETEROLITHOTOMY Left 11/2016    OB History   No obstetric history on file.      Home Medications    Prior to Admission medications   Medication Sig Start Date End Date Taking? Authorizing Provider  brompheniramine-pseudoephedrine-DM 30-2-10 MG/5ML syrup Take 10 mLs by mouth 4 (four) times daily as needed for up to 7 days. 10/10/21 10/17/21 Yes Laurene Footman B, PA-C  lidocaine-prilocaine (EMLA) cream Apply topically. 06/07/21   [provider]  LORazepam (ATIVAN) 1 MG tablet SMARTSIG:1 Tablet(s) By Mouth 06/16/21   [provider]  ondansetron (ZOFRAN) 4 MG tablet Take 4 mg by mouth every 8 (eight) hours as needed. 08/18/21   [provider]  Sod Picosulfate-Mag Ox-Cit Acd (CLENPIQ) 10-3.5-12 MG-GM -GM/160ML SOLN Take 320 mLs by mouth as directed. 09/01/21   Lin Landsman, MD  traMADol (ULTRAM) 50 MG tablet Take 50 mg by mouth every 6 (six) hours as needed. 06/26/21   [provider]  valACYclovir (VALTREX) 1000 MG tablet PLEASE SEE ATTACHED FOR DETAILED DIRECTIONS 06/07/21  [provider]    Family History Family History  Problem Relation Age of Onset   Heart Problems Father    Healthy Mother     Social History Social History   Tobacco Use   Smoking status: Never   Smokeless tobacco: Never  Vaping Use   Vaping Use: Never used  Substance Use Topics   Alcohol use: No   Drug use: No     Allergies   Patient has no known allergies.   Review of Systems Review of Systems  Constitutional:   Positive for fatigue and fever. Negative for chills and diaphoresis.  HENT:  Positive for congestion and rhinorrhea. Negative for ear pain, sinus pressure, sinus pain and sore throat.   Respiratory:  Positive for cough. Negative for shortness of breath and wheezing.   Gastrointestinal:  Negative for abdominal pain, nausea and vomiting.  Musculoskeletal:  Positive for myalgias. Negative for arthralgias.  Skin:  Negative for rash.  Neurological:  Negative for weakness and headaches.  Hematological:  Negative for adenopathy.    Physical Exam Triage Vital Signs ED Triage Vitals  Enc Vitals Group     BP 10/10/21 1824 126/71     Pulse Rate 10/10/21 1824 97     Resp 10/10/21 1824 (!) 22     Temp 10/10/21 1824 98.8 F (37.1 C)     Temp Source 10/10/21 1824 Oral     SpO2 10/10/21 1824 99 %     Weight --      Height --      Head Circumference --      Peak Flow --      Pain Score 10/10/21 1822 0     Pain Loc --      Pain Edu? --      Excl. in [place]? --    No data found.  Updated Vital Signs BP 126/71 (BP Location: Left Arm)   Pulse 97   Temp 98.8 F (37.1 C) (Oral)   Resp (!) 22   SpO2 99%       Physical Exam Vitals and nursing note reviewed.  Constitutional:      General: She is not in acute distress.    Appearance: Normal appearance. She is ill-appearing. She is not toxic-appearing.  HENT:     Head: Normocephalic and atraumatic.     Right Ear: Tympanic membrane, ear canal and external ear normal.     Left Ear: Tympanic membrane, ear canal and external ear normal.     Nose: Congestion and rhinorrhea present.     Comments: Patient has a bandage over her nose.  Nasal deformity present.    Mouth/Throat:     Mouth: Mucous membranes are moist.     Pharynx: Oropharynx is clear.  Eyes:     General: No scleral icterus.       Right eye: No discharge.        Left eye: No discharge.     Conjunctiva/sclera: Conjunctivae normal.  Cardiovascular:     Rate and Rhythm: Normal rate  and regular rhythm.     Heart sounds: Normal heart sounds.  Pulmonary:     Effort: Pulmonary effort is normal. No respiratory distress.     Breath sounds: Normal breath sounds. No wheezing, rhonchi or rales.  Musculoskeletal:     Cervical back: Neck supple.  Skin:    General: Skin is dry.  Neurological:     General: No focal deficit present.     Mental Status: She is  alert. Mental status is at baseline.     Motor: No weakness.     Gait: Gait normal.  Psychiatric:        Mood and Affect: Mood normal.        Behavior: Behavior normal.        Thought Content: Thought content normal.     UC Treatments / Results  Labs (all labs ordered are listed, but only abnormal results are displayed) Labs Reviewed  RESP PANEL BY RT-PCR (FLU A&B, COVID) [id number]    EKG   Radiology No results found.  Procedures Procedures (including critical care time)  Medications Ordered in UC Medications - No data to display  Initial Impression / Assessment and Plan / UC Course  I have reviewed the triage vital signs and the nursing notes.  Pertinent labs & imaging results that were available during my care of the patient were reviewed by me and considered in my medical decision making (see chart for details).  60 year old female presenting for 2-day history of feeling feverish, fatigue, body aches, cough, congestion.  Vitals are all normal and stable.  She is ill-appearing but nontoxic.  She does have nasal congestion on exam and clearish rhinorrhea.  Chest clear to auscultation heart regular rate and rhythm.  Respiratory panel ordered.  All negative.  Patient notified of result.  Patient advised she has a viral illness that is flulike.  Should resolve within 7 to 10 days or so.  Supportive care encouraged.  I sent Bromfed-DM to pharmacy but they notified patient they did not have the medication so I changed it to Promethazine DM.  Advised to increase rest and fluids, Tylenol or Motrin as needed for  body aches, headaches or general discomfort.  Reviewed return ED precautions.  Should be seen again if fever, worsening cough, increased breathing difficulty or the rib pain worsens.  Very low suspicion for pneumonia given her normal vital signs and clear chest but if symptoms are not improving or are worsening, may need chest x-ray to assess for that.   Final Clinical Impressions(s) / UC Diagnoses   Final diagnoses:  Viral illness  Acute cough  Nasal congestion     Discharge Instructions      URI/COLD SYMPTOMS: Your exam today is consistent with a viral illness. Antibiotics are not indicated at this time. Use medications as directed, including cough syrup, nasal saline, and decongestants. Your symptoms should improve over the next few days and resolve within 7-10 days. Increase rest and fluids. F/u if symptoms worsen or predominate such as sore throat, ear pain, productive cough, shortness of breath, or if you develop high fevers or worsening fatigue over the next several days.    You have received COVID testing today either for positive exposure, concerning symptoms that could be related to COVID infection, screening purposes, or re-testing after confirmed positive.  Your test obtained today checks for active viral infection in the last 1-2 weeks. If your test is negative now, you can still test positive later. So, if you do develop symptoms you should either get re-tested and/or isolate x 5 days and then strict mask use x 5 days (unvaccinated) or mask use x 10 days (vaccinated). Please follow CDC guidelines.  While Rapid antigen tests come back in 15-20 minutes, send out PCR/molecular test results typically come back within 1-3 days. In the mean time, if you are symptomatic, assume this could be a positive test and treat/monitor yourself as if you do have COVID.   We  will call with test results if positive. Please download the MyChart app and set up a profile to access test results.   If  symptomatic, go home and rest. Push fluids. Take Tylenol as needed for discomfort. Gargle warm salt water. Throat lozenges. Take Mucinex DM or Robitussin for cough. Humidifier in bedroom to ease coughing. Warm showers. Also review the COVID handout for more information.  COVID-19 INFECTION: The incubation period of COVID-19 is approximately 14 days after exposure, with most symptoms developing in roughly 4-5 days. Symptoms may range in severity from mild to critically severe. Roughly 80% of those infected will have mild symptoms. People of any age may become infected with COVID-19 and have the ability to transmit the virus. The most common symptoms include: fever, fatigue, cough, body aches, headaches, sore throat, nasal congestion, shortness of breath, nausea, vomiting, diarrhea, changes in smell and/or taste.    COURSE OF ILLNESS Some patients may begin with mild disease which can progress quickly into critical symptoms. If your symptoms are worsening please call ahead to the Emergency Department and proceed there for further treatment. Recovery time appears to be roughly 1-2 weeks for mild symptoms and 3-6 weeks for severe disease.   GO IMMEDIATELY TO ER FOR FEVER YOU ARE UNABLE TO GET DOWN WITH TYLENOL, BREATHING PROBLEMS, CHEST PAIN, FATIGUE, LETHARGY, INABILITY TO EAT OR DRINK, ETC  QUARANTINE AND ISOLATION: To help decrease the spread of COVID-19 please remain isolated if you have COVID infection or are highly suspected to have COVID infection. This means -stay home and isolate to one room in the home if you live with others. Do not share a bed or bathroom with others while ill, sanitize and wipe down all countertops and keep common areas clean and disinfected. Stay home for 5 days. If you have no symptoms or your symptoms are resolving after 5 days, you can leave your house. Continue to wear a mask around others for 5 additional days. If you have been in close contact (within 6 feet) of someone  diagnosed with COVID 19, you are advised to quarantine in your home for 14 days as symptoms can develop anywhere from 2-14 days after exposure to the virus. If you develop symptoms, you  must isolate.  Most current guidelines for COVID after exposure -unvaccinated: isolate 5 days and strict mask use x 5 days. Test on day 5 is possible -vaccinated: wear mask x 10 days if symptoms do not develop -You do not necessarily need to be tested for COVID if you have + exposure and  develop symptoms. Just isolate at home x10 days from symptom onset During this global pandemic, CDC advises to practice social distancing, try to stay at least 5ft away from others at all times. Wear a face covering. Wash and sanitize your hands regularly and avoid going anywhere that is not necessary.  KEEP IN MIND THAT THE COVID TEST IS NOT 100% ACCURATE AND YOU SHOULD STILL DO EVERYTHING TO PREVENT POTENTIAL SPREAD OF VIRUS TO OTHERS (WEAR MASK, WEAR GLOVES, [place] HANDS AND SANITIZE REGULARLY). IF INITIAL TEST IS NEGATIVE, THIS MAY NOT MEAN YOU ARE DEFINITELY NEGATIVE. MOST ACCURATE TESTING IS DONE 5-7 DAYS AFTER EXPOSURE.   It is not advised by CDC to get re-tested after receiving a positive COVID test since you can still test positive for weeks to months after you have already cleared the virus.   *If you have not been vaccinated for COVID, I strongly suggest you consider getting vaccinated as long as  there are no contraindications.       ED Prescriptions     Medication Sig Dispense Auth. Provider   brompheniramine-pseudoephedrine-DM 30-2-10 MG/5ML syrup Take 10 mLs by mouth 4 (four) times daily as needed for up to 7 days. 150 mL Danton Clap, PA-C      PDMP not reviewed this encounter.   Danton Clap, PA-C 10/10/21 1955

## 2021-10-10 NOTE — Discharge Instructions (Signed)

## 2021-10-10 NOTE — ED Triage Notes (Signed)
Pt presents with nasal congestion, left rib pain, cough and fever x 3 days.   Pt offered separate room for today's visit, she declined.

## 2021-10-18 ENCOUNTER — Encounter: Payer: Self-pay | Admitting: Gastroenterology

## 2021-10-18 ENCOUNTER — Other Ambulatory Visit: Payer: Self-pay

## 2021-10-18 DIAGNOSIS — Z1211 Encounter for screening for malignant neoplasm of colon: Secondary | ICD-10-CM

## 2021-10-19 ENCOUNTER — Other Ambulatory Visit: Payer: Self-pay

## 2021-10-19 ENCOUNTER — Ambulatory Visit: Payer: BC Managed Care – PPO | Admitting: Anesthesiology

## 2021-10-19 ENCOUNTER — Encounter
Admission: RE | Disposition: A | Discharge status: Home / Self Care | Payer: Self-pay | Source: Home / Self Care | Attending: Gastroenterology

## 2021-10-19 ENCOUNTER — Encounter: Payer: Self-pay | Admitting: Gastroenterology

## 2021-10-19 ENCOUNTER — Ambulatory Visit
Admission: RE | Admit: 2021-10-19 | Discharge: 2021-10-19 | Disposition: A | Discharge status: Home / Self Care | Payer: BC Managed Care – PPO | Attending: Gastroenterology | Admitting: Gastroenterology

## 2021-10-19 DIAGNOSIS — K5730 Diverticulosis of large intestine without perforation or abscess without bleeding: Secondary | ICD-10-CM | POA: Insufficient documentation

## 2021-10-19 DIAGNOSIS — D122 Benign neoplasm of ascending colon: Secondary | ICD-10-CM | POA: Diagnosis not present

## 2021-10-19 DIAGNOSIS — Z1211 Encounter for screening for malignant neoplasm of colon: Secondary | ICD-10-CM | POA: Diagnosis present

## 2021-10-19 HISTORY — PX: POLYPECTOMY: SHX5525

## 2021-10-19 HISTORY — PX: COLONOSCOPY WITH PROPOFOL: SHX5780

## 2021-10-19 HISTORY — DX: Nausea with vomiting, unspecified: R11.2

## 2021-10-19 HISTORY — DX: Basal cell carcinoma of skin, unspecified: C44.91

## 2021-10-19 HISTORY — DX: Other specified postprocedural states: Z98.890

## 2021-10-19 SURGERY — COLONOSCOPY WITH PROPOFOL
Anesthesia: General | Site: Rectum

## 2021-10-19 MED ORDER — ONDANSETRON HCL 4 MG/2ML IJ SOLN: 4.0000 mg | Freq: Once | INTRAMUSCULAR | Status: DC | PRN

## 2021-10-19 MED ORDER — STERILE WATER FOR IRRIGATION IR SOLN
Status: DC | PRN
  Administered 2021-10-19: 1

## 2021-10-19 MED ORDER — LIDOCAINE HCL (CARDIAC) PF 100 MG/5ML IV SOSY
PREFILLED_SYRINGE | INTRAVENOUS | Status: DC | PRN
  Administered 2021-10-19: 30 mg via INTRAVENOUS

## 2021-10-19 MED ORDER — PROPOFOL 10 MG/ML IV BOLUS
INTRAVENOUS | Status: DC | PRN
  Administered 2021-10-19: 30 mg via INTRAVENOUS
  Administered 2021-10-19: 40 mg via INTRAVENOUS
  Administered 2021-10-19: 30 mg via INTRAVENOUS
  Administered 2021-10-19: 140 mg via INTRAVENOUS
  Administered 2021-10-19: 40 mg via INTRAVENOUS
  Administered 2021-10-19 (×2): 30 mg via INTRAVENOUS
  Administered 2021-10-19: 40 mg via INTRAVENOUS
  Administered 2021-10-19: 30 mg via INTRAVENOUS
  Administered 2021-10-19: 20 mg via INTRAVENOUS
  Administered 2021-10-19 (×2): 30 mg via INTRAVENOUS

## 2021-10-19 MED ORDER — SODIUM CHLORIDE (PF) 0.9 % IJ SOLN
PREFILLED_SYRINGE | INTRAMUSCULAR | Status: DC | PRN
  Administered 2021-10-19: 3 mL

## 2021-10-19 MED ORDER — STERILE WATER FOR IRRIGATION IR SOLN
Status: DC | PRN
  Administered 2021-10-19: 60 mL

## 2021-10-19 MED ORDER — ACETAMINOPHEN 500 MG PO TABS: 1000.0000 mg | ORAL_TABLET | Freq: Once | ORAL | Status: DC | PRN

## 2021-10-19 MED ORDER — LACTATED RINGERS IV SOLN: INTRAVENOUS | Status: DC

## 2021-10-19 MED ORDER — SODIUM CHLORIDE 0.9 % IV SOLN: INTRAVENOUS | Status: DC

## 2021-10-19 MED ORDER — ACETAMINOPHEN 160 MG/5ML PO SOLN: 975.0000 mg | Freq: Once | ORAL | Status: DC | PRN

## 2021-10-19 SURGICAL SUPPLY — 22 items
CLIP HMST 235XBRD CATH ROT (MISCELLANEOUS) IMPLANT
CLIP RESOLUTION 360 11X235 (MISCELLANEOUS) ×6
ELECT REM PT RETURN 9FT ADLT (ELECTROSURGICAL) ×2
ELECTRODE REM PT RTRN 9FT ADLT (ELECTROSURGICAL) IMPLANT
ELEVIEW  INJECTABLE COMP 10 (MISCELLANEOUS) ×1
GOWN CVR UNV OPN BCK APRN NK (MISCELLANEOUS) ×2 IMPLANT
GOWN ISOL THUMB LOOP REG UNIV (MISCELLANEOUS) ×4
INJECTABLE ELEVIEW COMP 10 (MISCELLANEOUS) IMPLANT
INJECTOR VARIJECT VIN23 (MISCELLANEOUS) IMPLANT
KIT PRC NS LF DISP ENDO (KITS) ×1 IMPLANT
KIT PROCEDURE OLYMPUS (KITS) ×2
MANIFOLD NEPTUNE II (INSTRUMENTS) ×2 IMPLANT
MARKER SPOT ENDO TATTOO 5ML (MISCELLANEOUS) IMPLANT
NDL CARR LOCKE SCLERO (NEEDLE) IMPLANT
NEEDLE CARR LOCKE SCLERO (NEEDLE) ×2 IMPLANT
RETRIEVER NET ROTH 2.5X230 LF (MISCELLANEOUS) ×1 IMPLANT
SNARE LASSO HEX 3 IN 1 (INSTRUMENTS) ×1 IMPLANT
SPOT EX ENDOSCOPIC TATTOO (MISCELLANEOUS) ×1
SYR 10ML LL (SYRINGE) ×1 IMPLANT
TRAP ETRAP POLY (MISCELLANEOUS) ×1 IMPLANT
VARIJECT INJECTOR VIN23 (MISCELLANEOUS) ×2
WATER STERILE IRR 250ML POUR (IV SOLUTION) ×2 IMPLANT

## 2021-10-19 NOTE — Anesthesia Postprocedure Evaluation (Signed)
Anesthesia Post Note  Patient: Brittney Andrews  Procedure(s) Performed: COLONOSCOPY WITH BIOPSY (Rectum) POLYPECTOMY (Rectum)     Patient location during evaluation: PACU Anesthesia Type: General Level of consciousness: awake and alert Pain management: pain level controlled Vital Signs Assessment: post-procedure vital signs reviewed and stable Respiratory status: spontaneous breathing, nonlabored ventilation and respiratory function stable Cardiovascular status: blood pressure returned to baseline and stable Postop Assessment: no apparent nausea or vomiting Anesthetic complications: no   No notable events documented.  April Manson

## 2021-10-19 NOTE — Anesthesia Preprocedure Evaluation (Addendum)
Anesthesia Evaluation  Patient identified by MRN, date of birth, ID band Patient awake    Reviewed: Allergy & Precautions, H&P , NPO status , Patient's Chart, lab work & pertinent test results, reviewed documented beta blocker date and time   History of Anesthesia Complications (+) PONV and history of anesthetic complications  Airway Mallampati: III  TM Distance: >3 FB Neck ROM: full    Dental no notable dental hx.    Pulmonary neg pulmonary ROS,    Pulmonary exam normal breath sounds clear to auscultation       Cardiovascular Exercise Tolerance: Good negative cardio ROS   Rhythm:regular Rate:Normal     Neuro/Psych negative neurological ROS  negative psych ROS   GI/Hepatic negative GI ROS, Neg liver ROS,   Endo/Other  negative endocrine ROS  Renal/GU nephrolithiasis  negative genitourinary   Musculoskeletal   Abdominal   Peds  Hematology negative hematology ROS (+)   Anesthesia Other Findings BCC nose, reconstruction in process, right nasal passage occluded.  Reproductive/Obstetrics negative OB ROS                            Anesthesia Physical Anesthesia Plan  ASA: 2  Anesthesia Plan: General   Post-op Pain Management:    Induction:   PONV Risk Score and Plan: 3 and Treatment may vary due to age or medical condition, TIVA and Propofol infusion  Airway Management Planned:   Additional Equipment:   Intra-op Plan:   Post-operative Plan:   Informed Consent: I have reviewed the patients History and Physical, chart, labs and discussed the procedure including the risks, benefits and alternatives for the proposed anesthesia with the patient or authorized representative who has indicated his/her understanding and acceptance.     Dental Advisory Given  Plan Discussed with: CRNA  Anesthesia Plan Comments:         Anesthesia Quick Evaluation

## 2021-10-19 NOTE — H&P (Signed)
Brittney Darby, MD [address]  [place], [place] [address]  Main: [phone]  Fax: [phone] Pager: [phone]  Primary Care Physician:  Sofie Hartigan, MD Primary Gastroenterologist:  Dr. Cephas Andrews  Pre-Procedure History & Physical: HPI:  Brittney Andrews is a 60 y.o. female is here for an colonoscopy.   Past Medical History:  Diagnosis Date   Basal cell carcinoma    Diverticulitis    History of kidney stones 08/2017   right nephrolithiasis, left renal colic   PONV (postoperative nausea and vomiting)     Past Surgical History:  Procedure Laterality Date   ABDOMINAL HYSTERECTOMY     BREAST SURGERY     biopsy   COLONOSCOPY     CYSTOSCOPY Left 12/05/2016   stent placement   CYSTOSCOPY WITH STENT PLACEMENT Left 03/21/2021   Procedure: CYSTOSCOPY WITH STENT PLACEMENT;  Surgeon: Abbie Sons, MD;  Location: [facility];  Service: Urology;  Laterality: Left;   CYSTOSCOPY/URETEROSCOPY/HOLMIUM LASER/STENT PLACEMENT Left 04/11/2021   Procedure: CYSTOSCOPY/URETEROSCOPY/HOLMIUM LASER/STENT EXCHANGE;  Surgeon: Abbie Sons, MD;  Location: [facility];  Service: Urology;  Laterality: Left;   EXTRACORPOREAL SHOCK WAVE LITHOTRIPSY Left 09/05/2017   Procedure: EXTRACORPOREAL SHOCK WAVE LITHOTRIPSY (ESWL);  Surgeon: Hollice Espy, MD;  Location: [facility];  Service: Urology;  Laterality: Left;   EXTRACORPOREAL SHOCK WAVE LITHOTRIPSY Right 10/17/2017   Procedure: EXTRACORPOREAL SHOCK WAVE LITHOTRIPSY (ESWL);  Surgeon: Abbie Sons, MD;  Location: [facility];  Service: Urology;  Laterality: Right;   EYE SURGERY Bilateral    cats   URETEROLITHOTOMY Left 11/2016    Prior to Admission medications   Medication Sig Start Date End Date Taking? Authorizing Provider  lidocaine-prilocaine (EMLA) cream Apply topically. 06/07/21   [provider]  LORazepam (ATIVAN) 1 MG tablet SMARTSIG:1 Tablet(s) By Mouth Patient not taking: Reported on 10/18/2021  06/16/21   [provider]  ondansetron (ZOFRAN) 4 MG tablet Take 4 mg by mouth every 8 (eight) hours as needed. Patient not taking: Reported on 10/18/2021 08/18/21   [provider]  promethazine-dextromethorphan (PROMETHAZINE-DM) 6.25-15 MG/5ML syrup Take 5 mLs by mouth 4 (four) times daily as needed. Patient not taking: Reported on 10/18/2021 10/10/21   Danton Clap, PA-C  Sod Picosulfate-Mag Ox-Cit Acd (CLENPIQ) 10-3.5-12 MG-GM -GM/160ML SOLN Take 320 mLs by mouth as directed. 09/01/21   Lin Landsman, MD  traMADol (ULTRAM) 50 MG tablet Take 50 mg by mouth every 6 (six) hours as needed. Patient not taking: Reported on 10/18/2021 06/26/21   [provider]  valACYclovir (VALTREX) 1000 MG tablet PLEASE SEE ATTACHED FOR DETAILED DIRECTIONS Patient not taking: Reported on 10/18/2021 06/07/21   [provider]    Allergies as of 10/18/2021   (No Known Allergies)    Family History  Problem Relation Age of Onset   Heart Problems Father    Healthy Mother     Social History   Socioeconomic History   Marital status: Married    Spouse name: Not on file   Number of children: Not on file   Years of education: Not on file   Highest education level: Not on file  Occupational History    Comment: retired  Tobacco Use   Smoking status: Never   Smokeless tobacco: Never  Vaping Use   Vaping Use: Never used  Substance and Sexual Activity   Alcohol use: No   Drug use: No   Sexual activity: Not Currently  Other Topics Concern  Not on file  Social History Narrative   Patient lives in home with husband.  He has stage 4 cancer and she helps take care of him.   Currently he is feeling better and can do more for himself.   Patient feels safe in her home.   Social Determinants of Health   Financial Resource Strain: Not on file  Food Insecurity: Not on file  Transportation Needs: Not on file  Physical Activity: Not on file  Stress: Not on file   Social Connections: Not on file  Intimate Partner Violence: Not on file    Review of Systems: See HPI, otherwise negative ROS  Physical Exam: BP 107/74    Pulse 68    Temp 97.6 F (36.4 C) (Temporal)    Ht 5\' 8"  (1.727 m)    Wt 80.3 kg    SpO2 97%    BMI 26.91 kg/m  General:   Alert,  pleasant and cooperative in NAD Head:  Normocephalic and atraumatic. Neck:  Supple; no masses or thyromegaly. Lungs:  Clear throughout to auscultation.    Heart:  Regular rate and rhythm. Abdomen:  Soft, nontender and nondistended. Normal bowel sounds, without guarding, and without rebound.   Neurologic:  Alert and  oriented x4;  grossly normal neurologically.  Impression/Plan: Mahdiya Mossberg is here for an colonoscopy to be performed for colon cancer screening  Risks, benefits, limitations, and alternatives regarding  colonoscopy have been reviewed with the patient.  Questions have been answered.  All parties agreeable.   Sherri Sear, MD  10/19/2021, 7:46 AM

## 2021-10-19 NOTE — Op Note (Signed)
[facility] Gastroenterology Patient Name: Brittney Andrews Procedure Date: 10/19/2021 7:08 AM MRN: [id number] Account #: [id number] Date of Birth: 1961-06-07 Admit Type: Outpatient Age: 60 Room: [facility] OR ROOM 01 Gender: Female Note Status: Finalized Instrument Name: [id number] Procedure:             Colonoscopy Indications:           Screening for colorectal malignant neoplasm, Last                         colonoscopy: March 2004 Providers:             Lin Landsman MD, MD Referring MD:          Sofie Hartigan (Referring MD) Medicines:             General Anesthesia Complications:         No immediate complications. Estimated blood loss:                         Minimal. Procedure:             Pre-Anesthesia Assessment:                        - Prior to the procedure, a History and Physical was                         performed, and patient medications and allergies were                         reviewed. The patient is competent. The risks and                         benefits of the procedure and the sedation options and                         risks were discussed with the patient. All questions                         were answered and informed consent was obtained.                         Patient identification and proposed procedure were                         verified by the physician, the nurse, the                         anesthesiologist, the anesthetist and the technician                         in the pre-procedure area in the procedure room in the                         endoscopy suite. Mental Status Examination: alert and                         oriented. Airway Examination: normal oropharyngeal  airway and neck mobility. Respiratory Examination:                         clear to auscultation. CV Examination: normal.                         Prophylactic Antibiotics: The patient does not require                          prophylactic antibiotics. Prior Anticoagulants: The                         patient has taken no previous anticoagulant or                         antiplatelet agents. ASA Grade Assessment: II - A                         patient with mild systemic disease. After reviewing                         the risks and benefits, the patient was deemed in                         satisfactory condition to undergo the procedure. The                         anesthesia plan was to use general anesthesia.                         Immediately prior to administration of medications,                         the patient was re-assessed for adequacy to receive                         sedatives. The heart rate, respiratory rate, oxygen                         saturations, blood pressure, adequacy of pulmonary                         ventilation, and response to care were monitored                         throughout the procedure. The physical status of the                         patient was re-assessed after the procedure.                        After obtaining informed consent, the colonoscope was                         passed under direct vision. Throughout the procedure,                         the patient's blood pressure, pulse, and oxygen  saturations were monitored continuously. The                         Colonoscope was introduced through the anus and                         advanced to the the terminal ileum, with                         identification of the appendiceal orifice and IC                         valve. The colonoscopy was performed without                         difficulty. The patient tolerated the procedure well.                         The quality of the bowel preparation was evaluated                         using the BBPS [facility] Bowel Preparation Scale) with                         scores of: Right Colon = 3, Transverse Colon = 3 and                          Left Colon = 3 (entire mucosa seen well with no                         residual staining, small fragments of stool or opaque                         liquid). The total BBPS score equals 9. Findings:      The perianal and digital rectal examinations were normal. Pertinent       negatives include normal sphincter tone and no palpable rectal lesions.      A 15 mm polyp was found in the proximal ascending colon. The polyp was       flat. Preparations were made for mucosal resection. NBI was done to mark       the borders of the lesion. Eleview was injected with adequate lift of       the lesion from the muscularis propria. Snare mucosal resection with       Jabier Mutton net retrieval was performed. A 16 mm area was resected. Resection       and retrieval were complete. A slow ooze remained at the end of the       procedure. Area was successfully injected with 3 mL of a 1:10,000       solution of epinephrine for hemostasis of bleeding caused by the       procedure. For hemostasis, three hemostatic clips were successfully       placed (MR conditional). There was no bleeding at the end of the       procedure. Area was tattooed with an injection of Spot (carbon black).      Multiple diverticula were found in the recto-sigmoid colon, sigmoid       colon, transverse colon  and ascending colon. There was no evidence of       diverticular bleeding.      The retroflexed view of the distal rectum and anal verge was normal and       showed no anal or rectal abnormalities. Impression:            - One 15 mm polyp in the proximal ascending colon,                         removed with mucosal resection. Resected and                         retrieved. Injected. Clips (MR conditional) were                         placed. Tattooed.                        - Severe diverticulosis in the recto-sigmoid colon, in                         the sigmoid colon, in the transverse colon and in the                         ascending  colon. There was no evidence of diverticular                         bleeding.                        - The distal rectum and anal verge are normal on                         retroflexion view.                        - Mucosal resection was performed. Resection and                         retrieval were complete. Recommendation:        - Discharge patient to home (with escort).                        - Resume previous diet today.                        - Continue present medications.                        - Await pathology results.                        - Repeat colonoscopy in 3 years for surveillance. Procedure Code(s):     --- Professional ---                        [phone], Colonoscopy, flexible; with endoscopic mucosal                         resection Diagnosis Code(s):     --- Professional ---  Z12.11, Encounter for screening for malignant neoplasm                         of colon                        K63.5, Polyp of colon                        K57.30, Diverticulosis of large intestine without                         perforation or abscess without bleeding CPT copyright 2019 [employer]. All rights reserved. The codes documented in this report are preliminary and upon coder review may  be revised to meet current compliance requirements. Dr. Ulyess Mort Lin Landsman MD, MD 10/19/2021 8:37:28 AM This report has been signed electronically. Number of Addenda: 0 Note Initiated On: 10/19/2021 7:08 AM Scope Withdrawal Time: 0 hours 25 minutes 19 seconds  Total Procedure Duration: 0 hours 32 minutes 4 seconds  Estimated Blood Loss:  Estimated blood loss was minimal.      [facility]

## 2021-10-19 NOTE — Anesthesia Procedure Notes (Addendum)
Date/Time: 10/19/2021 7:58 AM Performed by: Cameron Ali, CRNA Pre-anesthesia Checklist: Patient identified, Emergency Drugs available, Suction available, Timeout performed and Patient being monitored Patient Re-evaluated:Patient Re-evaluated prior to induction Oxygen Delivery Method: Simple face mask Placement Confirmation: positive ETCO2

## 2021-10-19 NOTE — Transfer of Care (Signed)
Immediate Anesthesia Transfer of Care Note  Patient: Brittney Andrews  Procedure(s) Performed: COLONOSCOPY WITH BIOPSY (Rectum) POLYPECTOMY (Rectum)  Patient Location: PACU  Anesthesia Type: General  Level of Consciousness: awake, alert  and patient cooperative  Airway and Oxygen Therapy: Patient Spontanous Breathing and Patient connected to supplemental oxygen  Post-op Assessment: Post-op Vital signs reviewed, Patient's Cardiovascular Status Stable, Respiratory Function Stable, Patent Airway and No signs of Nausea or vomiting  Post-op Vital Signs: Reviewed and stable  Complications: No notable events documented.

## 2021-10-20 ENCOUNTER — Encounter: Payer: Self-pay | Admitting: Gastroenterology

## 2021-10-23 ENCOUNTER — Encounter: Payer: Self-pay | Admitting: Gastroenterology

## 2021-10-23 LAB — SURGICAL PATHOLOGY

## 2022-03-14 DIAGNOSIS — M12811 Other specific arthropathies, not elsewhere classified, right shoulder: Secondary | ICD-10-CM | POA: Insufficient documentation

## 2022-03-14 DIAGNOSIS — M722 Plantar fascial fibromatosis: Secondary | ICD-10-CM | POA: Insufficient documentation

## 2022-04-04 DIAGNOSIS — J342 Deviated nasal septum: Secondary | ICD-10-CM | POA: Insufficient documentation

## 2022-04-04 DIAGNOSIS — M952 Other acquired deformity of head: Secondary | ICD-10-CM | POA: Insufficient documentation

## 2022-04-04 DIAGNOSIS — J310 Chronic rhinitis: Secondary | ICD-10-CM | POA: Insufficient documentation

## 2022-04-17 IMAGING — CR DG ABDOMEN 1V
2 series · 2 of 2 positions shown · non-contrast
Comparison: Radiographs 10/17/2017.  CT 11/08/2014.

CLINICAL DATA: Left flank pain for 3 days with hematuria. History
of kidney stones.

EXAM:
ABDOMEN - 1 VIEW

[abdomen kub (1 of 2)]
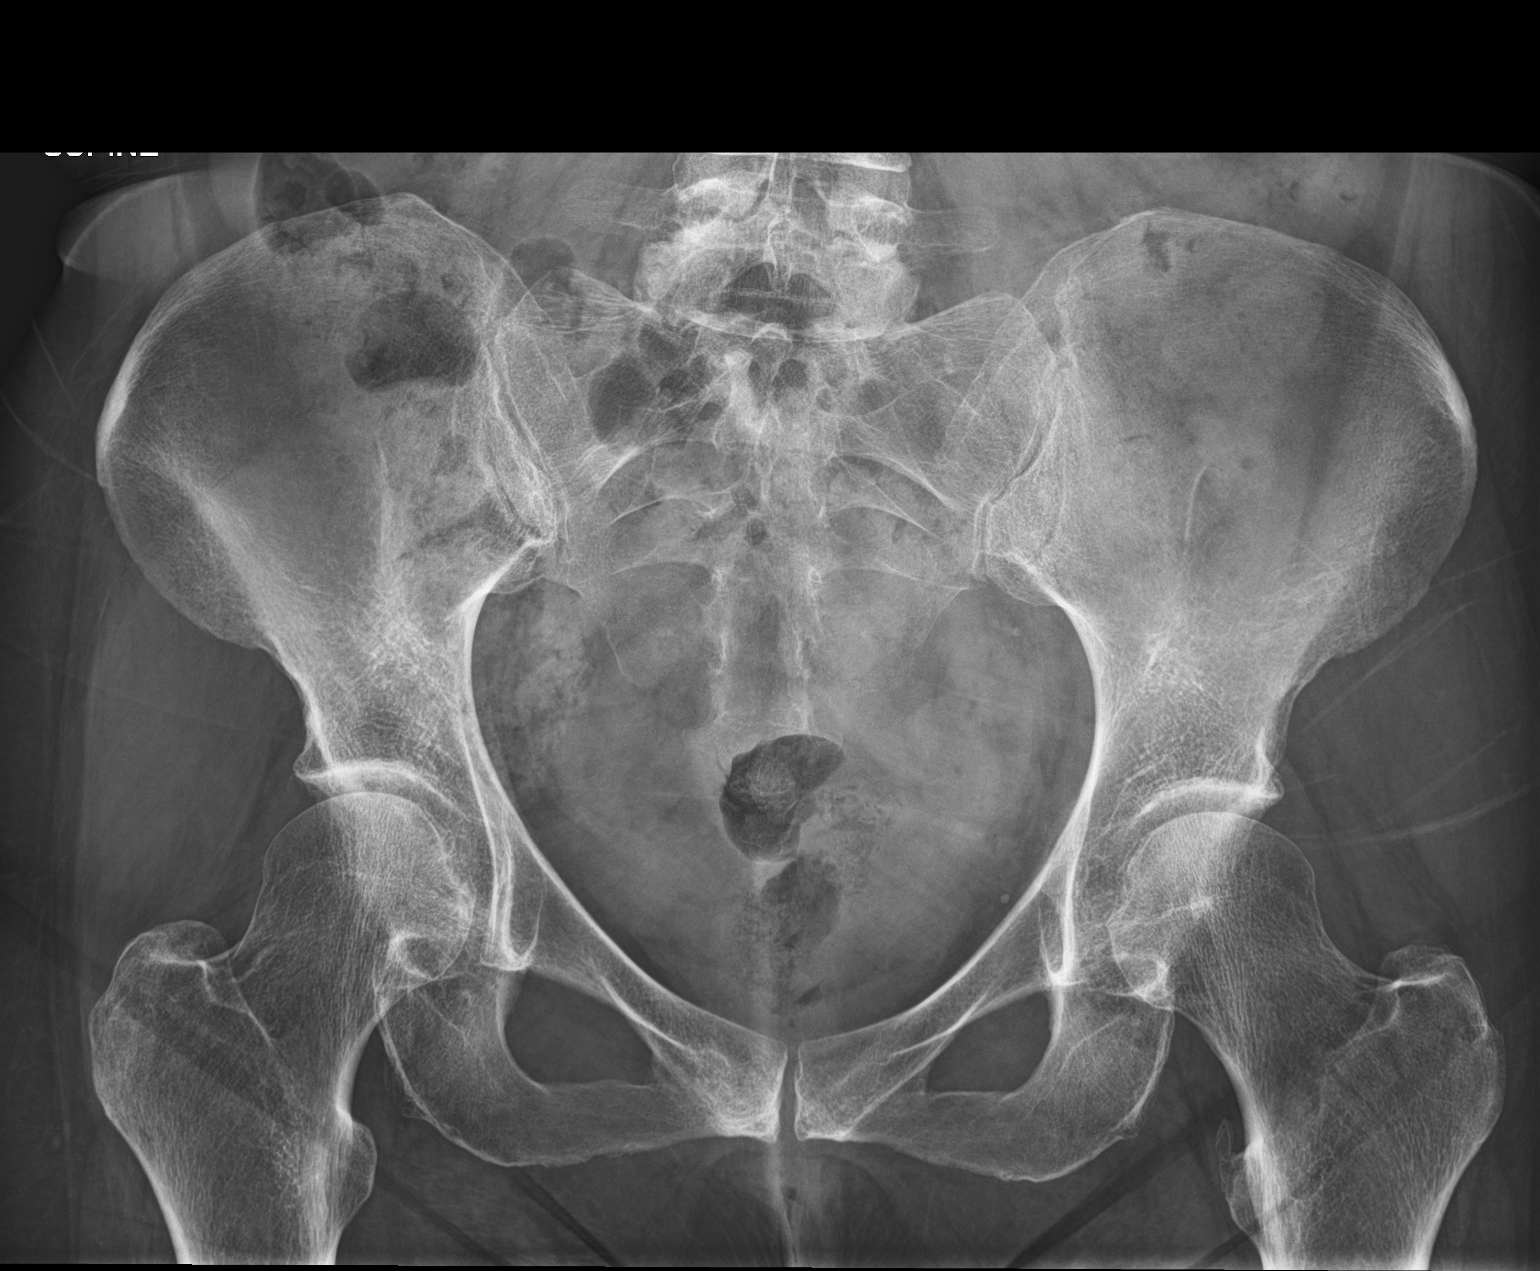

[abdomen kub (2 of 2)]
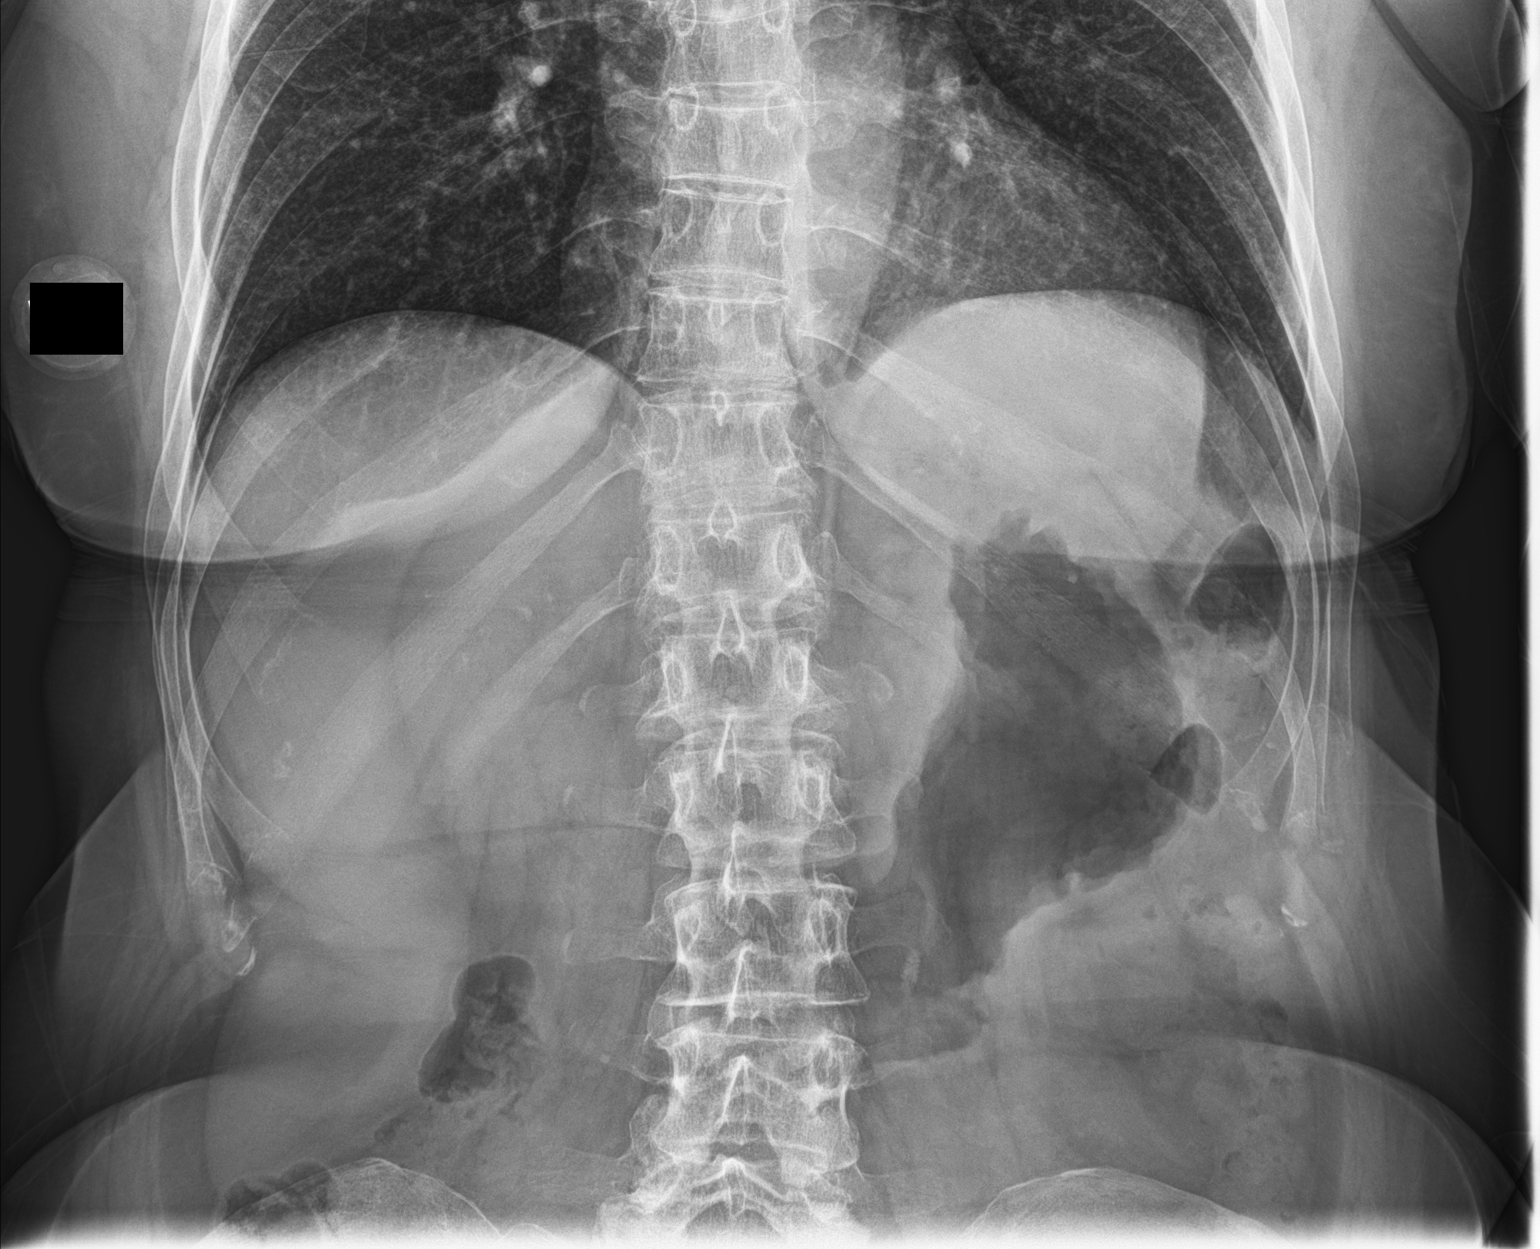

[2 of 2 positions shown; findings below may reference images not displayed]

FINDINGS: 11 x 3 mm calcification lies between the left L3 and L4 transverse
processes, suspicious for a proximal left ureteral calculus. Small
calcifications are noted overlying both kidneys, improved on the
right. Evaluation of the left kidney is limited by overlying bowel
gas. Punctate calcification overlying the right L4 transverse
process is unchanged, corresponding with a phlebolith on prior CT.
Faint pelvic calcifications bilaterally are stable. The bowel gas
pattern is normal. Mild lumbar spondylosis.
IMPRESSION: New calcification on the left at L3-4, suspicious for a proximal
left ureteral calculus. CT suggested for further evaluation.

## 2023-03-03 ENCOUNTER — Ambulatory Visit (INDEPENDENT_AMBULATORY_CARE_PROVIDER_SITE_OTHER): Payer: BC Managed Care – PPO

## 2023-03-03 ENCOUNTER — Ambulatory Visit
Admission: EM | Admit: 2023-03-03 | Discharge: 2023-03-03 | Disposition: A | Discharge status: Home / Self Care | Payer: BC Managed Care – PPO | Attending: Physician Assistant | Admitting: Physician Assistant

## 2023-03-03 DIAGNOSIS — R079 Chest pain, unspecified: Secondary | ICD-10-CM | POA: Diagnosis not present

## 2023-03-03 DIAGNOSIS — R051 Acute cough: Secondary | ICD-10-CM | POA: Diagnosis not present

## 2023-03-03 DIAGNOSIS — U071 COVID-19: Secondary | ICD-10-CM

## 2023-03-03 DIAGNOSIS — R509 Fever, unspecified: Secondary | ICD-10-CM | POA: Diagnosis not present

## 2023-03-03 LAB — SARS CORONAVIRUS 2 BY RT PCR: SARS Coronavirus 2 by RT PCR: POSITIVE — AB

## 2023-03-03 MED ORDER — PAXLOVID (300/100) 20 X 150 MG & 10 X 100MG PO TBPK
3.0000 | ORAL_TABLET | Freq: Two times a day (BID) | ORAL | 0 refills | Status: AC
Start: 2023-03-03 — End: 2023-03-08

## 2023-03-03 MED ORDER — PROMETHAZINE-DM 6.25-15 MG/5ML PO SYRP: 5.0000 mL | ORAL_SOLUTION | Freq: Four times a day (QID) | ORAL | 0 refills | Status: DC | PRN

## 2023-03-03 NOTE — ED Provider Notes (Signed)
[facility]    CSN: [id number] Arrival date & time: 03/03/23  9528      History   Chief Complaint Chief Complaint  Patient presents with   Fever   Cough   Headache   Chest Pain    HPI Brittney Andrews is a 62 y.o. female presenting for approximately 2-3day history of feeling feverish with temps up to 104 degrees, fatigue, body aches, cough, nasal congestion, chest pain and back pain.  Cough is mostly dry but occasionally productive.  Denies sore throat or breathing difficulty.  No vomiting or diarrhea.  No sick contacts or known exposure to COVID-19 or influenza.  Reports that about a week ago she sustained over 100 ant bites on her legs.  She does not know if that is potentially related to her current symptoms.  Has not taken any over-the-counter medication for symptoms.  Patient otherwise healthy but does have history of basal cell carcinoma.   HPI  Past Medical History:  Diagnosis Date   Basal cell carcinoma    Diverticulitis    History of kidney stones 08/2017   right nephrolithiasis, left renal colic   PONV (postoperative nausea and vomiting)     Patient Active Problem List   Diagnosis Date Noted   Screen for colon cancer    Adenomatous polyp of ascending colon    Basal cell carcinoma (BCC) of left ala nasi 09/01/2021   Basal cell carcinoma (BCC) of skin of left upper lip 09/01/2021   Nasal obstruction 09/01/2021   Basal cell carcinoma (BCC) of left cheek 06/21/2021   Mohs defect of skin of left upper lip 06/21/2021   Mohs defect of left nose 06/21/2021   Calculus of kidney 01/24/2017    Past Surgical History:  Procedure Laterality Date   ABDOMINAL HYSTERECTOMY     BREAST SURGERY     biopsy   COLONOSCOPY     COLONOSCOPY WITH PROPOFOL N/A 10/19/2021   Procedure: COLONOSCOPY WITH BIOPSY;  Surgeon: Toney Reil, MD;  Location: [facility];  Service: Endoscopy;  Laterality: N/A;   CYSTOSCOPY Left 12/05/2016   stent placement    CYSTOSCOPY WITH STENT PLACEMENT Left 03/21/2021   Procedure: CYSTOSCOPY WITH STENT PLACEMENT;  Surgeon: Riki Altes, MD;  Location: [facility];  Service: Urology;  Laterality: Left;   CYSTOSCOPY/URETEROSCOPY/HOLMIUM LASER/STENT PLACEMENT Left 04/11/2021   Procedure: CYSTOSCOPY/URETEROSCOPY/HOLMIUM LASER/STENT EXCHANGE;  Surgeon: Riki Altes, MD;  Location: [facility];  Service: Urology;  Laterality: Left;   EXTRACORPOREAL SHOCK WAVE LITHOTRIPSY Left 09/05/2017   Procedure: EXTRACORPOREAL SHOCK WAVE LITHOTRIPSY (ESWL);  Surgeon: Vanna Scotland, MD;  Location: [facility];  Service: Urology;  Laterality: Left;   EXTRACORPOREAL SHOCK WAVE LITHOTRIPSY Right 10/17/2017   Procedure: EXTRACORPOREAL SHOCK WAVE LITHOTRIPSY (ESWL);  Surgeon: Riki Altes, MD;  Location: [facility];  Service: Urology;  Laterality: Right;   EYE SURGERY Bilateral    cats   POLYPECTOMY N/A 10/19/2021   Procedure: POLYPECTOMY;  Surgeon: Toney Reil, MD;  Location: [facility];  Service: Endoscopy;  Laterality: N/A;  Proximal Ascending Colon Polyp:  [place], Clip x 3   RHINOPLASTY     URETEROLITHOTOMY Left 11/2016    OB History   No obstetric history on file.      Home Medications    Prior to Admission medications   Medication Sig Start Date End Date Taking? Authorizing Provider  nirmatrelvir & ritonavir (PAXLOVID, 300/100,) 20 x 150 MG & 10 x 100MG  TBPK Take 3 tablets by  mouth 2 (two) times daily for 5 days. 03/03/23 03/08/23 Yes Shirlee Latch, PA-C  promethazine-dextromethorphan (PROMETHAZINE-DM) 6.25-15 MG/5ML syrup Take 5 mLs by mouth 4 (four) times daily as needed. 03/03/23  Yes Eusebio Friendly B, PA-C  lidocaine-prilocaine (EMLA) cream Apply topically. 06/07/21   [provider]    Family History Family History  Problem Relation Age of Onset   Heart Problems Father    Healthy Mother     Social History Social History   Tobacco Use   Smoking status: Never   Smokeless tobacco:  Never  Vaping Use   Vaping Use: Never used  Substance Use Topics   Alcohol use: No   Drug use: No     Allergies   Patient has no known allergies.   Review of Systems Review of Systems  Constitutional:  Positive for fatigue and fever. Negative for chills and diaphoresis.  HENT:  Positive for congestion and rhinorrhea. Negative for ear pain, sinus pressure, sinus pain and sore throat.   Respiratory:  Positive for cough. Negative for shortness of breath and wheezing.   Cardiovascular:  Positive for chest pain.  Gastrointestinal:  Negative for abdominal pain, nausea and vomiting.  Musculoskeletal:  Positive for back pain and myalgias. Negative for arthralgias.  Skin:  Positive for rash.  Neurological:  Positive for headaches. Negative for weakness.  Hematological:  Negative for adenopathy.     Physical Exam Triage Vital Signs ED Triage Vitals  Enc Vitals Group     BP 10/10/21 1824 126/71     Pulse Rate 10/10/21 1824 97     Resp 10/10/21 1824 (!) 22     Temp 10/10/21 1824 98.8 F (37.1 C)     Temp Source 10/10/21 1824 Oral     SpO2 10/10/21 1824 99 %     Weight --      Height --      Head Circumference --      Peak Flow --      Pain Score 10/10/21 1822 0     Pain Loc --      Pain Edu? --      Excl. in GC? --    No data found.  Updated Vital Signs BP 121/73 (BP Location: Right Arm)   Pulse (!) 102   Temp 98.8 F (37.1 C) (Oral)   Resp (!) 22   Wt 175 lb (79.4 kg)   SpO2 97%   BMI 26.61 kg/m       Physical Exam Vitals and nursing note reviewed.  Constitutional:      General: She is not in acute distress.    Appearance: Normal appearance. She is ill-appearing. She is not toxic-appearing.  HENT:     Head: Normocephalic and atraumatic.     Right Ear: Tympanic membrane, ear canal and external ear normal.     Left Ear: Tympanic membrane, ear canal and external ear normal.     Nose: Congestion and rhinorrhea present.     Mouth/Throat:     Mouth: Mucous  membranes are moist.     Pharynx: Oropharynx is clear.  Eyes:     General: No scleral icterus.       Right eye: No discharge.        Left eye: No discharge.     Conjunctiva/sclera: Conjunctivae normal.  Cardiovascular:     Rate and Rhythm: Regular rhythm. Tachycardia present.     Heart sounds: Normal heart sounds.  Pulmonary:     Effort: Pulmonary effort  is normal. No respiratory distress.     Breath sounds: Normal breath sounds. No wheezing, rhonchi or rales.  Musculoskeletal:     Cervical back: Neck supple.  Skin:    General: Skin is dry.     Findings: Rash (multiple erythematous, dry lesions of legs consistent with insect bites. No evidence of infection) present.  Neurological:     General: No focal deficit present.     Mental Status: She is alert. Mental status is at baseline.     Motor: No weakness.     Gait: Gait normal.  Psychiatric:        Mood and Affect: Mood normal.        Behavior: Behavior normal.        Thought Content: Thought content normal.      UC Treatments / Results  Labs (all labs ordered are listed, but only abnormal results are displayed) Labs Reviewed  SARS CORONAVIRUS 2 BY RT PCR - Abnormal; Notable for the following components:      Result Value   SARS Coronavirus 2 by RT PCR POSITIVE (*)    All other components within normal limits     EKG   Radiology DG Chest 2 View  Result Date: 03/03/2023 CLINICAL DATA:  Fever with cough. EXAM: CHEST - 2 VIEW COMPARISON:  None Available. FINDINGS: The lungs are clear without focal pneumonia, edema, pneumothorax or pleural effusion. The cardiopericardial silhouette is within normal limits for size. No acute bony abnormality. IMPRESSION: No active cardiopulmonary disease. Electronically Signed   By: Kennith Center M.D.   On: 03/03/2023 10:52    Procedures Procedures (including critical care time)  Medications Ordered in UC Medications - No data to display  Initial Impression / Assessment and Plan / UC  Course  I have reviewed the triage vital signs and the nursing notes.  Pertinent labs & imaging results that were available during my care of the patient were reviewed by me and considered in my medical decision making (see chart for details).  62 year old female presenting for 2-day history of fever, fatigue, body aches, cough, congestion.  Vitals are all stable.  Mildly tachycardic. She is ill-appearing but nontoxic.  She does have nasal congestion on exam and clearish rhinorrhea.  Chest clear to auscultation heart regular rate and rhythm.  Multiple scattered areas of bug bites on legs but no sign of infection.  COVID testing obtained and positive.  Chest x-ray ordered given her complaint of chest and back pain.  Would like to rule out pneumonia. CXR  normal.  Patient advised of all results.  Reviewed current CDC guidelines, isolation protocol and ED precautions.  Supportive care encouraged.  Patient would like antiviral therapy.  Last GFR was 83 5 months ago 10/03/2022.  Sent Paxlovid to pharmacy.  Advised to increase rest and fluids, Tylenol or Motrin as needed for body aches, headaches or general discomfort.  Reviewed return ED precautions.  Should be seen again if not able to break fever, worsening cough, increased breathing difficulty or the chest/back worsens.  Patient is agreeable to plan.  Final Clinical Impressions(s) / UC Diagnoses   Final diagnoses:  COVID-19  Acute cough  Chest pain, unspecified type  Fever, unspecified     Discharge Instructions      -Your COVID test is positive today.  The new CDC guidelines state that you should isolate away from others until you have been fever free for 24 hours and your symptoms are beginning to improve.  After that  you should consider continue to wear mask for the next few days as long as you are coughing. -Normal chest x-ray. - I sent Paxlovid antiviral medication to the pharmacy for you.  I also sent cough medication.  Increase rest  and fluids.  Continue antipyretics for fever control. - Need to be seen again if you have any uncontrolled fever, weakness or breathing difficulty.       ED Prescriptions     Medication Sig Dispense Auth. Provider   nirmatrelvir & ritonavir (PAXLOVID, 300/100,) 20 x 150 MG & 10 x 100MG  TBPK Take 3 tablets by mouth 2 (two) times daily for 5 days. 30 tablet Shirlee Latch, PA-C   promethazine-dextromethorphan (PROMETHAZINE-DM) 6.25-15 MG/5ML syrup Take 5 mLs by mouth 4 (four) times daily as needed. 118 mL Shirlee Latch, PA-C      PDMP not reviewed this encounter.     Shirlee Latch, PA-C 03/03/23 1056

## 2023-03-03 NOTE — Discharge Instructions (Addendum)
-  Your COVID test is positive today.  The new CDC guidelines state that you should isolate away from others until you have been fever free for 24 hours and your symptoms are beginning to improve.  After that you should consider continue to wear mask for the next few days as long as you are coughing. -Normal chest x-ray. - I sent Paxlovid antiviral medication to the pharmacy for you.  I also sent cough medication.  Increase rest and fluids.  Continue antipyretics for fever control. - Need to be seen again if you have any uncontrolled fever, weakness or breathing difficulty.

## 2023-03-03 NOTE — ED Triage Notes (Signed)
Pt states that last Friday she got bit by 100 ants on her legs. She started getting a fever 2 days ago. Chest pain headache coughing. Back pain. She's been taking tylenol. Last dose was some time during the night.

## 2023-05-21 DIAGNOSIS — M950 Acquired deformity of nose: Secondary | ICD-10-CM | POA: Insufficient documentation

## 2023-05-21 DIAGNOSIS — Z9889 Other specified postprocedural states: Secondary | ICD-10-CM | POA: Insufficient documentation

## 2023-06-20 ENCOUNTER — Ambulatory Visit
Admission: RE | Admit: 2023-06-20 | Discharge: 2023-06-20 | Disposition: A | Discharge status: Home / Self Care | Payer: BC Managed Care – PPO | Source: Ambulatory Visit | Attending: Physician Assistant | Admitting: Physician Assistant

## 2023-06-20 ENCOUNTER — Encounter: Payer: Self-pay | Admitting: Physician Assistant

## 2023-06-20 ENCOUNTER — Ambulatory Visit (INDEPENDENT_AMBULATORY_CARE_PROVIDER_SITE_OTHER): Payer: BC Managed Care – PPO | Admitting: Physician Assistant

## 2023-06-20 ENCOUNTER — Ambulatory Visit
Admission: RE | Admit: 2023-06-20 | Discharge: 2023-06-20 | Disposition: A | Discharge status: Home / Self Care | Payer: BC Managed Care – PPO | Attending: Physician Assistant | Admitting: Physician Assistant

## 2023-06-20 ENCOUNTER — Other Ambulatory Visit: Payer: Self-pay

## 2023-06-20 VITALS — BP 135/73 | HR 77 | Temp 98.1°F | Ht 68.0 in | Wt 186.6 lb

## 2023-06-20 DIAGNOSIS — K5900 Constipation, unspecified: Secondary | ICD-10-CM | POA: Diagnosis not present

## 2023-06-20 DIAGNOSIS — R1084 Generalized abdominal pain: Secondary | ICD-10-CM | POA: Diagnosis not present

## 2023-06-20 DIAGNOSIS — Z86010 Personal history of colonic polyps: Secondary | ICD-10-CM

## 2023-06-20 DIAGNOSIS — R1011 Right upper quadrant pain: Secondary | ICD-10-CM

## 2023-06-20 DIAGNOSIS — R110 Nausea: Secondary | ICD-10-CM

## 2023-06-20 DIAGNOSIS — R197 Diarrhea, unspecified: Secondary | ICD-10-CM

## 2023-06-20 NOTE — Progress Notes (Signed)
Celso Amy, PA-C [address]  [address]  [place], [place] [address]  Main: [phone]  Fax: [phone]   Primary Care Physician: Alm Bustard, NP  Primary Gastroenterologist:  Celso Amy, PA-C / Dr. Lannette Donath    CC: Abdominal pain, history of diverticulitis  HPI: Brittney Andrews is a 62 y.o. female presents for evaluation of abdominal pain.  Current symptoms: She has been having generalized abdominal pain for a few months.  Has had worsening right upper quadrant pain for 2 weeks.  Has had nausea but no vomiting.  Had some bilateral lower abdominal cramping last night.  Has increased gas.  Episodes of diarrhea and constipation.  Noticed an episode of rectal bleeding last week which lasted 2 days and resolved.  No blood in her stool this week.  She denies fever or chills.  Colonoscopy done by Dr. Allegra Lai 10/2021 showed good prep, 1 large 15 mm sessile serrated polyp removed from proximal ascending colon, and severe pan diverticulosis.  3-year repeat.  No recent labs.  Last labs 09/2012 showed normal CBC and CMP with hemoglobin 14.9.  Last abdominal pelvic CT without contrast 06/2021 showed small nonobstructive bilateral renal calculi.  There was diverticulosis but no diverticulitis.  Previous hysterectomy.  No current outpatient medications on file.   No current facility-administered medications for this visit.    Allergies as of 06/20/2023   (No Known Allergies)    Past Medical History:  Diagnosis Date   Basal cell carcinoma    Diverticulitis    History of kidney stones 08/2017   right nephrolithiasis, left renal colic   PONV (postoperative nausea and vomiting)     Past Surgical History:  Procedure Laterality Date   ABDOMINAL HYSTERECTOMY     BREAST SURGERY     biopsy   COLONOSCOPY     COLONOSCOPY WITH PROPOFOL N/A 10/19/2021   Procedure: COLONOSCOPY WITH BIOPSY;  Surgeon: Toney Reil, MD;  Location: [facility];  Service:  Endoscopy;  Laterality: N/A;   CYSTOSCOPY Left 12/05/2016   stent placement   CYSTOSCOPY WITH STENT PLACEMENT Left 03/21/2021   Procedure: CYSTOSCOPY WITH STENT PLACEMENT;  Surgeon: Riki Altes, MD;  Location: [facility];  Service: Urology;  Laterality: Left;   CYSTOSCOPY/URETEROSCOPY/HOLMIUM LASER/STENT PLACEMENT Left 04/11/2021   Procedure: CYSTOSCOPY/URETEROSCOPY/HOLMIUM LASER/STENT EXCHANGE;  Surgeon: Riki Altes, MD;  Location: [facility];  Service: Urology;  Laterality: Left;   EXTRACORPOREAL SHOCK WAVE LITHOTRIPSY Left 09/05/2017   Procedure: EXTRACORPOREAL SHOCK WAVE LITHOTRIPSY (ESWL);  Surgeon: Vanna Scotland, MD;  Location: [facility];  Service: Urology;  Laterality: Left;   EXTRACORPOREAL SHOCK WAVE LITHOTRIPSY Right 10/17/2017   Procedure: EXTRACORPOREAL SHOCK WAVE LITHOTRIPSY (ESWL);  Surgeon: Riki Altes, MD;  Location: [facility];  Service: Urology;  Laterality: Right;   EYE SURGERY Bilateral    cats   POLYPECTOMY N/A 10/19/2021   Procedure: POLYPECTOMY;  Surgeon: Toney Reil, MD;  Location: [facility];  Service: Endoscopy;  Laterality: N/A;  Proximal Ascending Colon Polyp:  [place], Clip x 3   RHINOPLASTY     URETEROLITHOTOMY Left 11/2016    Review of Systems:    All systems reviewed and negative except where noted in HPI.   Physical Examination:   BP 135/73   Pulse 77   Temp 98.1 F (36.7 C)   Ht 5\' 8"  (1.727 m)   Wt 186 lb 9.6 oz (84.6 kg)   BMI 28.37 kg/m   General: Well-nourished, well-developed in no acute  distress.  Lungs: Clear to auscultation bilaterally. Non-labored. Heart: Regular rate and rhythm, no murmurs rubs or gallops.  Abdomen: Bowel sounds are normal; Abdomen is Soft; No hepatosplenomegaly, masses or hernias; Moderate right upper quadrant abdominal Tenderness; minimal bilateral lower abdominal tenderness.  No guarding or rebound tenderness. Neuro: Alert and oriented x 3.  Grossly intact.  Psych: Alert and cooperative,  normal mood and affect.   Imaging Studies: No results found.  Assessment and Plan:   Brittney Andrews is a 62 y.o. y/o female presents for abdominal pain mostly in the right upper quadrant.  Mild bilateral lower abdominal tenderness.  Differential includes cholelithiasis, constipation with overflow diarrhea, irritable bowel syndrome, and less likely diverticulitis.  I am ordering lab work, abdominal x-ray and RUQ ultrasound to begin her evaluation.  Pending results and symptoms, CT is the next step.  1.  RUQ Abdominal pain  Labs:  CBC, CMP, Lipase   RUQ Ultrasound  2.  Nausea without vomiting  She has Nausea medicine at home.  Declines prescription.  3.  Generalized Abd Pain with Hx Constipation / Diarrhea  Abdominal Xray - 1 View - Evaluate Stool Burden  Evaluate for Overflow Diarrhea from Constipation  4.  History of colon polyps  3-year repeat colonoscopy will be due 10/2024.  Celso Amy, PA-C  Follow up with TG in 4 weeks.

## 2023-06-20 NOTE — Patient Instructions (Signed)
Ultrasound scheduled 06/26/23 [facility] arrive at 9:15 am. Nothing to eat/drink after midnight.   You may go directly to have Xray of your abdomen at [facility] or [facility].[id number].

## 2023-06-21 LAB — COMPREHENSIVE METABOLIC PANEL
ALT: 15 IU/L (ref 0–32)
AST: 15 IU/L (ref 0–40)
Albumin: 4.5 g/dL (ref 3.9–4.9)
Alkaline Phosphatase: 131 IU/L — ABNORMAL HIGH (ref 44–121)
BUN/Creatinine Ratio: 17 (ref 12–28)
BUN: 16 mg/dL (ref 8–27)
Bilirubin Total: 0.6 mg/dL (ref 0.0–1.2)
CO2: 24 mmol/L (ref 20–29)
Calcium: 10.1 mg/dL (ref 8.7–10.3)
Chloride: 103 mmol/L (ref 96–106)
Creatinine, Ser: 0.92 mg/dL (ref 0.57–1.00)
Globulin, Total: 2.9 g/dL (ref 1.5–4.5)
Glucose: 104 mg/dL — ABNORMAL HIGH (ref 70–99)
Potassium: 4.4 mmol/L (ref 3.5–5.2)
Sodium: 141 mmol/L (ref 134–144)
Total Protein: 7.4 g/dL (ref 6.0–8.5)
eGFR: 70 mL/min/{1.73_m2} (ref >59)

## 2023-06-21 LAB — CBC WITH DIFFERENTIAL/PLATELET
Basophils Absolute: 0 10*3/uL (ref 0.0–0.2)
Basos: 0 %
EOS (ABSOLUTE): 0.1 10*3/uL (ref 0.0–0.4)
Eos: 1 %
Hematocrit: 42.5 % (ref 34.0–46.6)
Hemoglobin: 14.4 g/dL (ref 11.1–15.9)
Immature Grans (Abs): 0 10*3/uL (ref 0.0–0.1)
Immature Granulocytes: 0 %
Lymphocytes Absolute: 1.9 10*3/uL (ref 0.7–3.1)
Lymphs: 22 %
MCH: 31 pg (ref 26.6–33.0)
MCHC: 33.9 g/dL (ref 31.5–35.7)
MCV: 91 fL (ref 79–97)
Monocytes Absolute: 0.7 10*3/uL (ref 0.1–0.9)
Monocytes: 8 %
Neutrophils Absolute: 5.9 10*3/uL (ref 1.4–7.0)
Neutrophils: 69 %
Platelets: 358 10*3/uL (ref 150–450)
RBC: 4.65 x10E6/uL (ref 3.77–5.28)
RDW: 12.6 % (ref 11.7–15.4)
WBC: 8.6 10*3/uL (ref 3.4–10.8)

## 2023-06-21 LAB — LIPASE: Lipase: 33 U/L (ref 14–72)

## 2023-06-24 ENCOUNTER — Telehealth: Payer: Self-pay

## 2023-06-24 ENCOUNTER — Ambulatory Visit
Admission: RE | Admit: 2023-06-24 | Discharge: 2023-06-24 | Disposition: A | Discharge status: Home / Self Care | Payer: BC Managed Care – PPO | Source: Ambulatory Visit | Attending: Physician Assistant | Admitting: Physician Assistant

## 2023-06-24 DIAGNOSIS — R1011 Right upper quadrant pain: Secondary | ICD-10-CM | POA: Diagnosis present

## 2023-06-24 DIAGNOSIS — R110 Nausea: Secondary | ICD-10-CM | POA: Diagnosis present

## 2023-06-24 NOTE — Telephone Encounter (Signed)
Left message for patient to return call to office regarding results.  Please call and notify patient RUQ ultrasound shows no gallstones.  Common bile duct is normal.  Evidence of mild fatty liver.  Otherwise normal.  No liver masses.  How is patient feeling?  Of note, still waiting on abdominal x-ray result that was done 06/20/2023.  Please call [facility] and ask if they can read abdominal x-ray.  Please comment on stool burden.   Notify patient: Abdominal x-ray shows moderate stool throughout the colon, consistent with constipation.  No bowel obstruction.  I recommend she start OTC MiraLAX powder, mix 1 capful in a drink twice daily to help relieve constipation.  After constipation is better, then she can decrease MiraLAX to 1 capful once daily.  Drink 64 ounces of water daily.

## 2023-06-24 NOTE — Telephone Encounter (Signed)
Left message for patient to return call to office.  Please call and notify patient RUQ ultrasound shows no gallstones.  Common bile duct is normal.  Evidence of mild fatty liver.  Otherwise normal.  No liver masses.  How is patient feeling?  Of note, still waiting on abdominal x-ray result that was done 06/20/2023.  Please call [facility] and ask if they can read abdominal x-ray.  Please comment on stool burden.

## 2023-06-24 NOTE — Telephone Encounter (Signed)
Spoke with patient and she will try the miralax. She is aware of all results. Keep follow up appointment 07-12-23 2 11:15.

## 2023-06-24 NOTE — Progress Notes (Signed)
Please call and notify patient RUQ ultrasound shows no gallstones.  Common bile duct is normal.  Evidence of mild fatty liver.  Otherwise normal.  No liver masses.  How is patient feeling?  Of note, still waiting on abdominal x-ray result that was done 06/20/2023.  Please call [facility] and ask if they can read abdominal x-ray.  Please comment on stool burden.

## 2023-06-24 NOTE — Progress Notes (Signed)
Notify patient: Abdominal x-ray shows moderate stool throughout the colon, consistent with constipation.  No bowel obstruction.  I recommend she start OTC MiraLAX powder, mix 1 capful in a drink twice daily to help relieve constipation.  After constipation is better, then she can decrease MiraLAX to 1 capful once daily.  Drink 64 ounces of water daily.

## 2023-06-26 ENCOUNTER — Other Ambulatory Visit: Payer: BC Managed Care – PPO

## 2023-07-12 ENCOUNTER — Encounter: Payer: Self-pay | Admitting: Physician Assistant

## 2023-07-12 ENCOUNTER — Ambulatory Visit (INDEPENDENT_AMBULATORY_CARE_PROVIDER_SITE_OTHER): Payer: BC Managed Care – PPO | Admitting: Physician Assistant

## 2023-07-12 VITALS — BP 112/68 | HR 69 | Temp 97.8°F | Ht 68.0 in | Wt 185.2 lb

## 2023-07-12 DIAGNOSIS — R1084 Generalized abdominal pain: Secondary | ICD-10-CM

## 2023-07-12 DIAGNOSIS — K5900 Constipation, unspecified: Secondary | ICD-10-CM

## 2023-07-12 DIAGNOSIS — R748 Abnormal levels of other serum enzymes: Secondary | ICD-10-CM

## 2023-07-12 MED ORDER — LUBIPROSTONE 8 MCG PO CAPS: 8.0000 ug | ORAL_CAPSULE | Freq: Two times a day (BID) | ORAL | 5 refills | Status: DC

## 2023-07-12 NOTE — Progress Notes (Signed)
Brittney Amy, PA-C [address]  [address]  [place], [place] [address]  Main: [phone]  Fax: [phone]   Primary Care Physician: Alm Bustard, NP  Primary Gastroenterologist:  Brittney Amy, PA-C / Dr. Lannette Donath    CC: Follow-up abdominal pain  HPI: Brittney Andrews is a 62 y.o. female returns for 1 month follow-up of abdominal pain.  She has had right upper quadrant and generalized abdominal pain.  Intermittent abdominal cramping, gas, episodes of diarrhea and constipation.  She was recently started on MiraLAX for constipation.  Her right side abdominal pain has currently resolved.  Constipation has improved.  Now she is having 5 loose stools daily on MiraLAX 1 capful daily.  She reports continued belching and lower intestinal gas.  No more abdominal pain.  Labs 06/20/2023 showed normal CBC, CMP, and lipase except mildly elevated alk phos 131.  Normal hemoglobin 14.4.  WBC 8.6.  RUQ ultrasound 06/24/2023 showed mild hepatic steatosis, otherwise normal.  No gallstones.  CBD 3 mm.  Abdominal x-ray 06/24/2023 showed moderate volume stool throughout the colon and rectum consistent with constipation.  No obstruction.  She was started on MiraLAX.  Colonoscopy done by Dr. Allegra Lai 10/2021 showed good prep, 1 large 15 mm sessile serrated polyp removed from proximal ascending colon, and severe pan diverticulosis.  3-year repeat.   Abdominal pelvic CT without contrast 06/2021 showed small nonobstructive bilateral renal calculi. There was diverticulosis but no diverticulitis. Previous hysterectomy.   Current Outpatient Medications  Medication Sig Dispense Refill   lubiprostone (AMITIZA) 8 MCG capsule Take 1 capsule (8 mcg total) by mouth 2 (two) times daily with a meal. 60 capsule 5   No current facility-administered medications for this visit.    Allergies as of 07/12/2023   (No Known Allergies)    Past Medical History:  Diagnosis Date   Basal cell carcinoma     Diverticulitis    History of kidney stones 08/2017   right nephrolithiasis, left renal colic   PONV (postoperative nausea and vomiting)     Past Surgical History:  Procedure Laterality Date   ABDOMINAL HYSTERECTOMY     BREAST SURGERY     biopsy   COLONOSCOPY     COLONOSCOPY WITH PROPOFOL N/A 10/19/2021   Procedure: COLONOSCOPY WITH BIOPSY;  Surgeon: Toney Reil, MD;  Location: [facility];  Service: Endoscopy;  Laterality: N/A;   CYSTOSCOPY Left 12/05/2016   stent placement   CYSTOSCOPY WITH STENT PLACEMENT Left 03/21/2021   Procedure: CYSTOSCOPY WITH STENT PLACEMENT;  Surgeon: Riki Altes, MD;  Location: [facility];  Service: Urology;  Laterality: Left;   CYSTOSCOPY/URETEROSCOPY/HOLMIUM LASER/STENT PLACEMENT Left 04/11/2021   Procedure: CYSTOSCOPY/URETEROSCOPY/HOLMIUM LASER/STENT EXCHANGE;  Surgeon: Riki Altes, MD;  Location: [facility];  Service: Urology;  Laterality: Left;   EXTRACORPOREAL SHOCK WAVE LITHOTRIPSY Left 09/05/2017   Procedure: EXTRACORPOREAL SHOCK WAVE LITHOTRIPSY (ESWL);  Surgeon: Vanna Scotland, MD;  Location: [facility];  Service: Urology;  Laterality: Left;   EXTRACORPOREAL SHOCK WAVE LITHOTRIPSY Right 10/17/2017   Procedure: EXTRACORPOREAL SHOCK WAVE LITHOTRIPSY (ESWL);  Surgeon: Riki Altes, MD;  Location: [facility];  Service: Urology;  Laterality: Right;   EYE SURGERY Bilateral    cats   POLYPECTOMY N/A 10/19/2021   Procedure: POLYPECTOMY;  Surgeon: Toney Reil, MD;  Location: [facility];  Service: Endoscopy;  Laterality: N/A;  Proximal Ascending Colon Polyp:  [place], Clip x 3   RHINOPLASTY     URETEROLITHOTOMY Left 11/2016  Review of Systems:    All systems reviewed and negative except where noted in HPI.   Physical Examination:   BP 112/68   Pulse 69   Temp 97.8 F (36.6 C)   Ht 5\' 8"  (1.727 m)   Wt 185 lb 3.2 oz (84 kg)   BMI 28.16 kg/m   General: Well-nourished, well-developed in no acute distress.   Lungs: Clear to auscultation bilaterally. Non-labored. Heart: Regular rate and rhythm, no murmurs rubs or gallops.  Abdomen: Bowel sounds are normal; Abdomen is Soft; No hepatosplenomegaly, masses or hernias;  No Abdominal Tenderness; No guarding or rebound tenderness. Neuro: Alert and oriented x 3.  Grossly intact.  Psych: Alert and cooperative, normal mood and affect.   Imaging Studies: DG Abd 1 View  Result Date: 06/24/2023 CLINICAL DATA:  Right upper quadrant pain, constipation EXAM: ABDOMEN - 1 VIEW COMPARISON:  06/09/2021 FINDINGS: The bowel gas pattern is normal. Mild-moderate volume stool throughout the colon and rectum. No radio-opaque calculi or other significant radiographic abnormality are seen. IMPRESSION: Negative. Electronically Signed   By: Duanne Guess D.O.   On: 06/24/2023 12:21   US ABDOMEN LIMITED RUQ (LIVER/GB)  Result Date: 06/24/2023 CLINICAL DATA:  Right upper quadrant abdomen pain. EXAM: ULTRASOUND ABDOMEN LIMITED RIGHT UPPER QUADRANT COMPARISON:  CT abdomen and pelvis June 22 2021 FINDINGS: Gallbladder: No gallstones or wall thickening visualized. No sonographic Murphy sign noted by sonographer. Common bile duct: Diameter: 3 mm Liver: Mild increased echotexture. No focal liver lesion. Portal vein is patent on color Doppler imaging with normal direction of blood flow towards the liver. Other: None. IMPRESSION: 1. No acute abnormality identified. 2. Mild increased echotexture of the liver, nonspecific but may represent mild fatty infiltration. Electronically Signed   By: Sherian Rein M.D.   On: 06/24/2023 09:06    Assessment and Plan:   [name] is a 62 y.o. y/o female returns for follow-up of abdominal pain.  Recent RUQ ultrasound showed mild hepatic steatosis, otherwise unrevealing.  Labs showed slightly elevated alk phos 131, otherwise normal CMP, CBC, and lipase.  Abdominal x-ray showed moderate constipation.  Suspect abdominal pain is due to  constipation.  Suspect she has episodes of overflow diarrhea from constipation.  Her abdominal pain is currently resolved since she started taking MiraLAX daily.  Now she is having loose stools with increased gas on MiraLAX.  No more abdominal pain.  1.  Abdominal pain - Due  to constipation  Currently resolved post treatment for Constipation.  Abdominal pelvic CT if abdominal pain returns.  2.  Constipation - Chronic, Lifelong  Discussed constipation treatment at length. Stop MiraLAX which caused diarrhea and gas. Start Amitiza 1 capsule twice daily with food.  3.  Mildly elevated alkaline phosphatase Labs: Alkaline phosphatase isoenzymes, GGT, intact PTH and calcium, hepatic panel, ANA, AMA, ASMA, TSH.   4.  Belching / Gas  Start Align Probiotic 1 capsule once daily.  Try OTC Pepcid / Prilosec.  Avoid Gas Producing foods, high fructose corn syrup, sorbitol, and mannitol.  Avoid carbonated drinks and sodas.  Try low FODMAP diet.   Brittney Amy, PA-C  Follow up in 3 months with TG.

## 2023-07-12 NOTE — Patient Instructions (Addendum)
Stop Miralax.  If you have recurrent Constipation, then Start Amitiza (Lubiprostone) 8mg  1-2 tablets daily with a meal.  Take OTC Pepcid or Prilosec if you have increased belching or acid reflux.

## 2023-07-15 ENCOUNTER — Telehealth: Payer: Self-pay

## 2023-07-15 LAB — GAMMA GT: GGT: 12 IU/L (ref 0–60)

## 2023-07-15 LAB — HEPATIC FUNCTION PANEL
ALT: 13 IU/L (ref 0–32)
AST: 15 IU/L (ref 0–40)
Albumin: 4.4 g/dL (ref 3.9–4.9)
Alkaline Phosphatase: 126 IU/L — ABNORMAL HIGH (ref 44–121)
Bilirubin Total: 0.5 mg/dL (ref 0.0–1.2)
Bilirubin, Direct: 0.11 mg/dL (ref 0.00–0.40)
Total Protein: 6.8 g/dL (ref 6.0–8.5)

## 2023-07-15 LAB — MITOCHONDRIAL/SMOOTH MUSCLE AB PNL
Mitochondrial Ab: <20 U (ref 0.0–20.0)
Smooth Muscle Ab: 2 U (ref 0–19)

## 2023-07-15 LAB — PTH, INTACT AND CALCIUM
Calcium: 10.2 mg/dL (ref 8.7–10.3)
PTH: 37 pg/mL (ref 15–65)

## 2023-07-15 LAB — ANA: Anti Nuclear Antibody (ANA): NEGATIVE

## 2023-07-15 LAB — VITAMIN D 25 HYDROXY (VIT D DEFICIENCY, FRACTURES): Vit D, 25-Hydroxy: 23.5 ng/mL — ABNORMAL LOW (ref 30.0–100.0)

## 2023-07-15 LAB — ALKALINE PHOSPHATASE, ISOENZYMES
BONE FRACTION: 41 % (ref 14–68)
INTESTINAL FRAC.: 3 % (ref 0–18)
LIVER FRACTION: 56 % (ref 18–85)

## 2023-07-15 LAB — TSH: TSH: 0.928 u[IU]/mL (ref 0.450–4.500)

## 2023-07-15 NOTE — Progress Notes (Signed)
Call and notify patient lab results showed: 1.  Alkaline phosphatase test is still mildly elevated, yet improved compared to previous labs.  All other liver test are normal. 2.  Vitamin D level is low.  **Please start 800 international units of vitamin D daily.  **Recommend lab visit in 3 months to recheck vitamin D and alk phos level. 3.  PTH and calcium are normal.  No evidence of hyperparathyroidism. 4.  GGT and alk phos isoenzymes are normal.  No evidence of liver disease. 5.  TSH thyroid test is normal. 6.  ANA, AMA, and ASMA are normal.  No evidence of autoimmune hepatitis or primary biliary cirrhosis.  No evidence of liver disease. Celso Amy, PA-C

## 2023-07-15 NOTE — Telephone Encounter (Signed)
Patient notified.  Call and notify patient lab results showed:  1.  Alkaline phosphatase test is still mildly elevated, yet improved compared to previous labs.  All other liver test are normal.  2.  Vitamin D level is low.  **Please start 800 international units of vitamin D daily.  **Recommend lab visit in 3 months to recheck vitamin D and alk phos level.  3.  PTH and calcium are normal.  No evidence of hyperparathyroidism.  4.  GGT and alk phos isoenzymes are normal.  No evidence of liver disease.  5.  TSH thyroid test is normal.  6.  ANA, AMA, and ASMA are normal.  No evidence of autoimmune hepatitis or primary biliary cirrhosis.  No evidence of liver disease.

## 2023-09-30 ENCOUNTER — Encounter: Payer: Self-pay | Admitting: Dermatology

## 2023-09-30 ENCOUNTER — Ambulatory Visit: Payer: BC Managed Care – PPO | Admitting: Dermatology

## 2023-09-30 DIAGNOSIS — L570 Actinic keratosis: Secondary | ICD-10-CM

## 2023-09-30 DIAGNOSIS — L720 Epidermal cyst: Secondary | ICD-10-CM

## 2023-09-30 DIAGNOSIS — W908XXA Exposure to other nonionizing radiation, initial encounter: Secondary | ICD-10-CM | POA: Diagnosis not present

## 2023-09-30 DIAGNOSIS — D492 Neoplasm of unspecified behavior of bone, soft tissue, and skin: Secondary | ICD-10-CM | POA: Diagnosis not present

## 2023-09-30 DIAGNOSIS — Z85828 Personal history of other malignant neoplasm of skin: Secondary | ICD-10-CM | POA: Diagnosis not present

## 2023-09-30 DIAGNOSIS — D485 Neoplasm of uncertain behavior of skin: Secondary | ICD-10-CM

## 2023-09-30 NOTE — Patient Instructions (Addendum)
Cryotherapy Aftercare  Wash gently with soap and water everyday.   Apply Vaseline and Band-Aid daily until healed.   Wound Care Instructions  Cleanse wound gently with soap and water once a day then pat dry with clean gauze. Apply a thin coat of Petrolatum (petroleum jelly, "Vaseline") over the wound (unless you have an allergy to this). We recommend that you use a new, sterile tube of Vaseline. Do not pick or remove scabs. Do not remove the yellow or white "healing tissue" from the base of the wound.  Cover the wound with fresh, clean, nonstick gauze and secure with paper tape. You may use Band-Aids in place of gauze and tape if the wound is small enough, but would recommend trimming much of the tape off as there is often too much. Sometimes Band-Aids can irritate the skin.  You should call the office for your biopsy report after 1 week if you have not already been contacted.  If you experience any problems, such as abnormal amounts of bleeding, swelling, significant bruising, significant pain, or evidence of infection, please call the office immediately.  FOR ADULT SURGERY PATIENTS: If you need something for pain relief you may take 1 extra strength Tylenol (acetaminophen) AND 2 Ibuprofen (200mg  each) together every 4 hours as needed for pain. (do not take these if you are allergic to them or if you have a reason you should not take them.) Typically, you may only need pain medication for 1 to 3 days.     Due to recent changes in healthcare laws, you may see results of your pathology and/or laboratory studies on MyChart before the doctors have had a chance to review them. We understand that in some cases there may be results that are confusing or concerning to you. Please understand that not all results are received at the same time and often the doctors may need to interpret multiple results in order to provide you with the best plan of care or course of treatment. Therefore, we ask that you  please give [place] 2 business days to thoroughly review all your results before contacting the office for clarification. Should we see a critical lab result, you will be contacted sooner.   If You Need Anything After Your Visit  If you have any questions or concerns for your doctor, please call our main line at [phone] and press option 4 to reach your doctor's medical assistant. If no one answers, please leave a voicemail as directed and we will return your call as soon as possible. Messages left after 4 pm will be answered the following business day.   You may also send [place] a message via MyChart. We typically respond to MyChart messages within 1-2 business days.  For prescription refills, please ask your pharmacy to contact our office. Our fax number is [phone].  If you have an urgent issue when the clinic is closed that cannot wait until the next business day, you can page your doctor at the number below.    Please note that while we do our best to be available for urgent issues outside of office hours, we are not available 24/7.   If you have an urgent issue and are unable to reach [place], you may choose to seek medical care at your doctor's office, retail clinic, urgent care center, or emergency room.  If you have a medical emergency, please immediately call 911 or go to the emergency department.  Pager Numbers  - Dr. Gwen Pounds: [phone]  -  Dr. Roseanne Reno: [phone]  - Dr. Katrinka Blazing: [phone]   In the event of inclement weather, please call our main line at [phone] for an update on the status of any delays or closures.  Dermatology Medication Tips: Please keep the boxes that topical medications come in in order to help keep track of the instructions about where and how to use these. Pharmacies typically print the medication instructions only on the boxes and not directly on the medication tubes.   If your medication is too expensive, please contact our office at  [phone] option 4 or send [place] a message through MyChart.   We are unable to tell what your co-pay for medications will be in advance as this is different depending on your insurance coverage. However, we may be able to find a substitute medication at lower cost or fill out paperwork to get insurance to cover a needed medication.   If a prior authorization is required to get your medication covered by your insurance company, please allow [place] 1-2 business days to complete this process.  Drug prices often vary depending on where the prescription is filled and some pharmacies may offer cheaper prices.  The website www.goodrx.com contains coupons for medications through different pharmacies. The prices here do not account for what the cost may be with help from insurance (it may be cheaper with your insurance), but the website can give you the price if you did not use any insurance.  - You can print the associated coupon and take it with your prescription to the pharmacy.  - You may also stop by our office during regular business hours and pick up a GoodRx coupon card.  - If you need your prescription sent electronically to a different pharmacy, notify our office through [facility] or by phone at [phone] option 4.

## 2023-09-30 NOTE — Progress Notes (Signed)
New Patient Visit   Subjective  Brittney Andrews is a 62 y.o. female who presents for the following: spot at right temple. Does not go away, present for at least 6 months. Patient with hx of BCC.   The patient has spots, moles and lesions to be evaluated, some may be new or changing and the patient may have concern these could be cancer.   The following portions of the chart were reviewed this encounter and updated as appropriate: medications, allergies, medical history  Review of Systems:  No other skin or systemic complaints except as noted in HPI or Assessment and Plan.  Objective  Well appearing patient in no apparent distress; mood and affect are within normal limits.   A focused examination was performed of the following areas: Face, abdomen  Relevant exam findings are noted in the Assessment and Plan.  Right Temple Erythematous thin papules/macules with gritty scale.      left flank 6 mm subcutaneous nodule       Assessment & Plan     Actinic keratosis Right Temple  Actinic keratoses are precancerous spots that appear secondary to cumulative UV radiation exposure/sun exposure over time. They are chronic with expected duration over 1 year. A portion of actinic keratoses will progress to squamous cell carcinoma of the skin. It is not possible to reliably predict which spots will progress to skin cancer and so treatment is recommended to prevent development of skin cancer.  Recommend daily broad spectrum sunscreen SPF 30+ to sun-exposed areas, reapply every 2 hours as needed.  Recommend staying in the shade or wearing long sleeves, sun glasses (UVA+UVB protection) and wide brim hats (4-inch brim around the entire circumference of the hat). Call for new or changing lesions.   Destruction of lesion - Right Temple Complexity: simple   Destruction method: cryotherapy   Informed consent: discussed and consent obtained   Timeout:  patient name, date of  birth, surgical site, and procedure verified Lesion destroyed using liquid nitrogen: Yes   Region frozen until ice ball extended beyond lesion: Yes   Cryo cycles: 1 or 2. Outcome: patient tolerated procedure well with no complications   Post-procedure details: wound care instructions given    Neoplasm of uncertain behavior of skin left flank  Epidermal / dermal shaving  Lesion diameter (cm):  0.6 Informed consent: discussed and consent obtained   Timeout: patient name, date of birth, surgical site, and procedure verified   Procedure prep:  Patient was prepped and draped in usual sterile fashion Prep type:  Povidone-iodine and isopropyl alcohol Anesthesia: the lesion was anesthetized in a standard fashion   Anesthetic:  1% lidocaine w/ epinephrine 1-100,000 buffered w/ 8.4% NaHCO3 Instrument used: DermaBlade   Hemostasis achieved with: pressure and aluminum chloride   Outcome: patient tolerated procedure well   Post-procedure details: wound care instructions given    Specimen 1 - Surgical pathology Differential Diagnosis: EIC  Check Margins: No 6 mm subcutaneous nodule  Clinically consistent with EIC Patient was offered shave removal today or appointment for excision and closure for high rate of clearance and thinner scar. Patient prefers shave removal.   EIC (epidermal inclusion cyst)  HISTORY OF BASAL CELL CARCINOMA OF THE SKIN - No evidence of recurrence today - Recommend regular full body skin exams - Recommend daily broad spectrum sunscreen SPF 30+ to sun-exposed areas, reapply every 2 hours as needed.  - Call if any new or changing lesions are noted between office visits  Return offered return for TBSE. patient will schedule when ready.  Anise Salvo, RMA, am acting as scribe for Elie Goody, MD .   Documentation: I have reviewed the above documentation for accuracy and completeness, and I agree with the above.  Elie Goody, MD

## 2023-10-02 LAB — SURGICAL PATHOLOGY

## 2023-10-10 ENCOUNTER — Ambulatory Visit: Payer: BC Managed Care – PPO | Admitting: Dermatology

## 2023-10-10 ENCOUNTER — Encounter: Payer: Self-pay | Admitting: Dermatology

## 2023-10-10 DIAGNOSIS — L720 Epidermal cyst: Secondary | ICD-10-CM

## 2023-10-10 DIAGNOSIS — Z5189 Encounter for other specified aftercare: Secondary | ICD-10-CM

## 2023-10-10 NOTE — Patient Instructions (Addendum)

## 2023-10-10 NOTE — Progress Notes (Signed)
   Follow-Up Visit   Subjective  Brittney Andrews is a 62 y.o. female who presents for the following: bx site L flank pt feels infected, sore, pt putting vaseline and bandage The patient has spots, moles and lesions to be evaluated, some may be new or changing and the patient may have concern these could be cancer.   The following portions of the chart were reviewed this encounter and updated as appropriate: medications, allergies, medical history  Review of Systems:  No other skin or systemic complaints except as noted in HPI or Assessment and Plan.  Objective  Well appearing patient in no apparent distress; mood and affect are within normal limits.   A focused examination was performed of the following areas: L flank  Relevant exam findings are noted in the Assessment and Plan.    Assessment & Plan   WOUND Secondary to bx proven Cyst shave removal L flank Exam: normal healing bx site. No evidence of infection  Treatment Plan: Cont wound care qd, vaseline and band aid  wound cleansed with Puracyn,  followed by Mupirocin and band aid. Green discharge is fibrin; part of healing  Visit for wound check    Return patient will schedule FBSE when ready.  I, Ardis Rowan, RMA, am acting as scribe for Elie Goody, MD .   Documentation: I have reviewed the above documentation for accuracy and completeness, and I agree with the above.  Elie Goody, MD

## 2023-10-11 ENCOUNTER — Ambulatory Visit: Payer: BC Managed Care – PPO | Admitting: Physician Assistant

## 2023-10-14 ENCOUNTER — Encounter: Payer: Self-pay | Admitting: Dermatology

## 2024-01-30 ENCOUNTER — Inpatient Hospital Stay: Payer: Self-pay

## 2024-06-11 ENCOUNTER — Telehealth: Payer: Self-pay

## 2024-06-11 NOTE — Telephone Encounter (Signed)
 Pt is due for repeat colonoscopy 10/2024 with Dr Unk... Pt would like to call [facility] first before making the decision to have Dr jinny perform the repeat procedure

## 2024-10-05 ENCOUNTER — Ambulatory Visit: Admission: RE | Admit: 2024-10-05 | Source: Home / Self Care | Admitting: Gastroenterology

## 2024-10-05 ENCOUNTER — Encounter: Admission: RE | Payer: Self-pay | Source: Home / Self Care

## 2024-10-05 SURGERY — COLONOSCOPY
Anesthesia: General
